# Patient Record
Sex: Male | Born: 1940 | Race: White | Hispanic: No | Marital: Married | Smoking: Never smoker
Health system: Southern US, Community
[De-identification: ages and names within clinical notes are randomized; demographics above are authoritative.]

## PROBLEM LIST (undated history)

## (undated) DIAGNOSIS — K635 Polyp of colon: Secondary | ICD-10-CM

## (undated) DIAGNOSIS — E785 Hyperlipidemia, unspecified: Secondary | ICD-10-CM

## (undated) DIAGNOSIS — C801 Malignant (primary) neoplasm, unspecified: Secondary | ICD-10-CM

## (undated) DIAGNOSIS — E781 Pure hyperglyceridemia: Secondary | ICD-10-CM

## (undated) DIAGNOSIS — F419 Anxiety disorder, unspecified: Secondary | ICD-10-CM

## (undated) DIAGNOSIS — R51 Headache: Secondary | ICD-10-CM

## (undated) DIAGNOSIS — F5104 Psychophysiologic insomnia: Secondary | ICD-10-CM

## (undated) DIAGNOSIS — L409 Psoriasis, unspecified: Secondary | ICD-10-CM

## (undated) DIAGNOSIS — Z974 Presence of external hearing-aid: Secondary | ICD-10-CM

## (undated) DIAGNOSIS — G4730 Sleep apnea, unspecified: Secondary | ICD-10-CM

## (undated) DIAGNOSIS — T7840XA Allergy, unspecified, initial encounter: Secondary | ICD-10-CM

## (undated) DIAGNOSIS — R011 Cardiac murmur, unspecified: Secondary | ICD-10-CM

## (undated) DIAGNOSIS — I10 Essential (primary) hypertension: Secondary | ICD-10-CM

## (undated) DIAGNOSIS — Z8489 Family history of other specified conditions: Secondary | ICD-10-CM

## (undated) DIAGNOSIS — K219 Gastro-esophageal reflux disease without esophagitis: Secondary | ICD-10-CM

## (undated) DIAGNOSIS — K589 Irritable bowel syndrome without diarrhea: Secondary | ICD-10-CM

## (undated) DIAGNOSIS — R519 Headache, unspecified: Secondary | ICD-10-CM

## (undated) HISTORY — PX: TONSILLECTOMY: SUR1361

## (undated) HISTORY — PX: PROSTATECTOMY: SHX69

## (undated) HISTORY — PX: CIRCUMCISION: SUR203

---

## 2017-07-05 ENCOUNTER — Encounter: Payer: Self-pay | Admitting: *Deleted

## 2017-07-06 ENCOUNTER — Encounter
Admission: RE | Disposition: A | Discharge status: Home / Self Care | Payer: Self-pay | Source: Ambulatory Visit | Attending: Gastroenterology

## 2017-07-06 ENCOUNTER — Ambulatory Visit: Payer: Medicare Other | Admitting: Anesthesiology

## 2017-07-06 ENCOUNTER — Ambulatory Visit
Admission: RE | Admit: 2017-07-06 | Discharge: 2017-07-06 | Disposition: A | Discharge status: Home / Self Care | Payer: Medicare Other | Source: Ambulatory Visit | Attending: Gastroenterology | Admitting: Gastroenterology

## 2017-07-06 DIAGNOSIS — K635 Polyp of colon: Secondary | ICD-10-CM | POA: Insufficient documentation

## 2017-07-06 DIAGNOSIS — D124 Benign neoplasm of descending colon: Secondary | ICD-10-CM | POA: Diagnosis not present

## 2017-07-06 DIAGNOSIS — Z79899 Other long term (current) drug therapy: Secondary | ICD-10-CM | POA: Insufficient documentation

## 2017-07-06 DIAGNOSIS — F5104 Psychophysiologic insomnia: Secondary | ICD-10-CM | POA: Insufficient documentation

## 2017-07-06 DIAGNOSIS — Z881 Allergy status to other antibiotic agents status: Secondary | ICD-10-CM | POA: Insufficient documentation

## 2017-07-06 DIAGNOSIS — L409 Psoriasis, unspecified: Secondary | ICD-10-CM | POA: Insufficient documentation

## 2017-07-06 DIAGNOSIS — Z885 Allergy status to narcotic agent status: Secondary | ICD-10-CM | POA: Diagnosis not present

## 2017-07-06 DIAGNOSIS — Z7982 Long term (current) use of aspirin: Secondary | ICD-10-CM | POA: Insufficient documentation

## 2017-07-06 DIAGNOSIS — Z1211 Encounter for screening for malignant neoplasm of colon: Secondary | ICD-10-CM | POA: Insufficient documentation

## 2017-07-06 DIAGNOSIS — Z880 Allergy status to penicillin: Secondary | ICD-10-CM | POA: Diagnosis not present

## 2017-07-06 DIAGNOSIS — K589 Irritable bowel syndrome without diarrhea: Secondary | ICD-10-CM | POA: Insufficient documentation

## 2017-07-06 DIAGNOSIS — D123 Benign neoplasm of transverse colon: Secondary | ICD-10-CM | POA: Insufficient documentation

## 2017-07-06 DIAGNOSIS — K5730 Diverticulosis of large intestine without perforation or abscess without bleeding: Secondary | ICD-10-CM | POA: Diagnosis not present

## 2017-07-06 DIAGNOSIS — K219 Gastro-esophageal reflux disease without esophagitis: Secondary | ICD-10-CM | POA: Diagnosis present

## 2017-07-06 DIAGNOSIS — Z86010 Personal history of colonic polyps: Secondary | ICD-10-CM | POA: Diagnosis present

## 2017-07-06 DIAGNOSIS — E781 Pure hyperglyceridemia: Secondary | ICD-10-CM | POA: Diagnosis not present

## 2017-07-06 DIAGNOSIS — E785 Hyperlipidemia, unspecified: Secondary | ICD-10-CM | POA: Insufficient documentation

## 2017-07-06 DIAGNOSIS — G4730 Sleep apnea, unspecified: Secondary | ICD-10-CM | POA: Insufficient documentation

## 2017-07-06 DIAGNOSIS — D128 Benign neoplasm of rectum: Secondary | ICD-10-CM | POA: Insufficient documentation

## 2017-07-06 DIAGNOSIS — Z888 Allergy status to other drugs, medicaments and biological substances status: Secondary | ICD-10-CM | POA: Diagnosis not present

## 2017-07-06 HISTORY — DX: Hyperlipidemia, unspecified: E78.5

## 2017-07-06 HISTORY — DX: Psychophysiologic insomnia: F51.04

## 2017-07-06 HISTORY — PX: COLONOSCOPY WITH PROPOFOL: SHX5780

## 2017-07-06 HISTORY — DX: Headache: R51

## 2017-07-06 HISTORY — DX: Anxiety disorder, unspecified: F41.9

## 2017-07-06 HISTORY — DX: Malignant (primary) neoplasm, unspecified: C80.1

## 2017-07-06 HISTORY — DX: Psoriasis, unspecified: L40.9

## 2017-07-06 HISTORY — DX: Allergy, unspecified, initial encounter: T78.40XA

## 2017-07-06 HISTORY — DX: Sleep apnea, unspecified: G47.30

## 2017-07-06 HISTORY — DX: Pure hyperglyceridemia: E78.1

## 2017-07-06 HISTORY — DX: Essential (primary) hypertension: I10

## 2017-07-06 HISTORY — DX: Headache, unspecified: R51.9

## 2017-07-06 HISTORY — DX: Irritable bowel syndrome, unspecified: K58.9

## 2017-07-06 SURGERY — COLONOSCOPY WITH PROPOFOL
Anesthesia: General

## 2017-07-06 MED ORDER — LIDOCAINE HCL (PF) 2 % IJ SOLN
INTRAMUSCULAR | Status: AC
  Filled 2017-07-06: qty 2

## 2017-07-06 MED ORDER — LIDOCAINE HCL (CARDIAC) 20 MG/ML IV SOLN
INTRAVENOUS | Status: DC | PRN
  Administered 2017-07-06: 2 mL via INTRAVENOUS

## 2017-07-06 MED ORDER — SODIUM CHLORIDE 0.9 % IV SOLN: INTRAVENOUS | Status: DC

## 2017-07-06 MED ORDER — SODIUM CHLORIDE 0.9 % IV SOLN
INTRAVENOUS | Status: DC
  Administered 2017-07-06: 08:00:00 via INTRAVENOUS

## 2017-07-06 MED ORDER — EPHEDRINE SULFATE 50 MG/ML IJ SOLN
INTRAMUSCULAR | Status: DC | PRN
  Administered 2017-07-06 (×3): 10 mg via INTRAVENOUS

## 2017-07-06 MED ORDER — EPHEDRINE SULFATE 50 MG/ML IJ SOLN
INTRAMUSCULAR | Status: AC
  Filled 2017-07-06: qty 1

## 2017-07-06 MED ORDER — PROPOFOL 10 MG/ML IV BOLUS
INTRAVENOUS | Status: AC
  Filled 2017-07-06: qty 20

## 2017-07-06 MED ORDER — PROPOFOL 500 MG/50ML IV EMUL
INTRAVENOUS | Status: DC | PRN
  Administered 2017-07-06: 120 ug/kg/min via INTRAVENOUS

## 2017-07-06 MED ORDER — PROPOFOL 10 MG/ML IV BOLUS
INTRAVENOUS | Status: DC | PRN
  Administered 2017-07-06 (×2): 30 mg via INTRAVENOUS

## 2017-07-06 NOTE — Anesthesia Postprocedure Evaluation (Signed)
Anesthesia Post Note  Patient: Anothony Bursch Hover  Procedure(s) Performed: Procedure(s) (LRB): COLONOSCOPY WITH PROPOFOL (N/A)  Patient location during evaluation: PACU Anesthesia Type: General Level of consciousness: awake Pain management: pain level controlled Vital Signs Assessment: post-procedure vital signs reviewed and stable Respiratory status: spontaneous breathing Cardiovascular status: stable Anesthetic complications: no     Last Vitals:  Vitals:   07/06/17 0950 07/06/17 1000  BP: 134/76 (!) 147/84  Pulse: 62 62  Resp: 12 18  Temp:      Last Pain:  Vitals:   07/06/17 0920  TempSrc: Tympanic                 VAN STAVEREN,Lynnette Pote

## 2017-07-06 NOTE — Anesthesia Preprocedure Evaluation (Signed)
Anesthesia Evaluation  Patient identified by MRN, date of birth, ID band Patient awake    Reviewed: Allergy & Precautions  Airway Mallampati: II       Dental  (+) Teeth Intact   Pulmonary sleep apnea ,    breath sounds clear to auscultation       Cardiovascular Exercise Tolerance: Good hypertension, Pt. on medications  Rhythm:Regular     Neuro/Psych  Headaches, Anxiety    GI/Hepatic negative GI ROS, Neg liver ROS,   Endo/Other  negative endocrine ROS  Renal/GU negative Renal ROS     Musculoskeletal   Abdominal Normal abdominal exam  (+)   Peds negative pediatric ROS (+)  Hematology   Anesthesia Other Findings   Reproductive/Obstetrics                             Anesthesia Physical Anesthesia Plan  ASA: II  Anesthesia Plan: General   Post-op Pain Management:    Induction: Intravenous  PONV Risk Score and Plan: 0  Airway Management Planned: Natural Airway and Nasal Cannula  Additional Equipment:   Intra-op Plan:   Post-operative Plan:   Informed Consent: I have reviewed the patients History and Physical, chart, labs and discussed the procedure including the risks, benefits and alternatives for the proposed anesthesia with the patient or authorized representative who has indicated his/her understanding and acceptance.     Plan Discussed with: Surgeon  Anesthesia Plan Comments:         Anesthesia Quick Evaluation

## 2017-07-06 NOTE — Op Note (Signed)
[facility] Gastroenterology Patient Name: Richard Melton Procedure Date: 07/06/2017 8:14 AM MRN: [id number] Account #: [id number] Date of Birth: 19-Jun-1941 Admit Type: Outpatient Age: 76 Room: [facility] ENDO ROOM 3 Gender: Male Note Status: Finalized Procedure:            Colonoscopy Indications:          Personal history of colonic polyps Providers:            Lollie Sails, MD Referring MD:         Irven Easterly. Kary Kos, MD (Referring MD) Medicines:            Monitored Anesthesia Care Complications:        No immediate complications. Procedure:            Pre-Anesthesia Assessment:                       - ASA Grade Assessment: III - A patient with severe                        systemic disease.                       After obtaining informed consent, the colonoscope was                        passed under direct vision. Throughout the procedure,                        the patient's blood pressure, pulse, and oxygen                        saturations were monitored continuously. The                        Colonoscope was introduced through the anus and                        advanced to the the cecum, identified by appendiceal                        orifice and ileocecal valve. The colonoscopy was                        unusually difficult due to poor bowel prep and a                        tortuous colon. Successful completion of the procedure                        was aided by changing the patient to a supine position                        and using manual pressure. The patient tolerated the                        procedure well. The quality of the bowel preparation                        was fair. Findings:      Multiple medium-mouthed diverticula were found in the sigmoid colon  and       descending colon.      A 2 mm polyp was found in the rectum. The polyp was sessile. The polyp       was removed with a cold biopsy forceps. Resection and retrieval were   complete.      A 3 mm polyp was found in the descending colon. The polyp was sessile.       The polyp was removed with a cold biopsy forceps. Resection and       retrieval were complete.      A 3 mm polyp was found in the transverse colon. The polyp was flat. The       polyp was removed with a cold biopsy forceps. Resection and retrieval       were complete.      A 2 mm polyp was found in the hepatic flexure. The polyp was sessile.       The polyp was removed with a cold biopsy forceps. Resection and       retrieval were complete.      A 2 mm polyp was found in the proximal transverse colon. The polyp was       sessile. The polyp was removed with a cold biopsy forceps. Resection and       retrieval were complete.      The retroflexed view of the distal rectum and anal verge was normal and       showed no anal or rectal abnormalities.      The digital rectal exam was normal. Impression:           - Preparation of the colon was fair.                       - Diverticulosis in the sigmoid colon and in the                        descending colon.                       - One 2 mm polyp in the rectum, removed with a cold                        biopsy forceps. Resected and retrieved.                       - One 3 mm polyp in the descending colon, removed with                        a cold biopsy forceps. Resected and retrieved.                       - One 3 mm polyp in the transverse colon, removed with                        a cold biopsy forceps. Resected and retrieved.                       - One 2 mm polyp at the hepatic flexure, removed with a                        cold biopsy forceps. Resected and retrieved.                       -  One 2 mm polyp in the proximal transverse colon,                        removed with a cold biopsy forceps. Resected and                        retrieved.                       - The distal rectum and anal verge are normal on                         retroflexion view. Recommendation:       - Discharge patient to home. Procedure Code(s):    --- Professional ---                       [phone], Colonoscopy, flexible; with biopsy, single or                        multiple Diagnosis Code(s):    --- Professional ---                       K62.1, Rectal polyp                       D12.4, Benign neoplasm of descending colon                       D12.3, Benign neoplasm of transverse colon (hepatic                        flexure or splenic flexure)                       Z86.010, Personal history of colonic polyps                       K57.30, Diverticulosis of large intestine without                        perforation or abscess without bleeding CPT copyright 2016 American Medical Association. All rights reserved. The codes documented in this report are preliminary and upon coder review may  be revised to meet current compliance requirements. Lollie Sails, MD 07/06/2017 9:21:20 AM This report has been signed electronically. Number of Addenda: 0 Note Initiated On: 07/06/2017 8:14 AM Scope Withdrawal Time: 0 hours 22 minutes 56 seconds  Total Procedure Duration: 0 hours 38 minutes 55 seconds       [facility]

## 2017-07-06 NOTE — Transfer of Care (Signed)
Immediate Anesthesia Transfer of Care Note  Patient: Richard Melton  Procedure(s) Performed: Procedure(s): COLONOSCOPY WITH PROPOFOL (N/A)  Patient Location: PACU  Anesthesia Type:General  Level of Consciousness: awake  Airway & Oxygen Therapy: Patient Spontanous Breathing and Patient connected to nasal cannula oxygen  Post-op Assessment: Report given to RN and Post -op Vital signs reviewed and stable  Post vital signs: Reviewed and stable  Last Vitals:  Vitals:   07/06/17 0740  BP: (!) 142/85  Pulse: (!) 58  Resp: 18  Temp: (!) 35.6 C    Last Pain:  Vitals:   07/06/17 0740  TempSrc: Tympanic         Complications: No apparent anesthesia complications

## 2017-07-06 NOTE — H&P (Signed)
Outpatient short stay form Pre-procedure 07/06/2017 8:19 AM Lollie Sails MD  Primary Physician: Dr. [place] Pink  Reason for visit:  Colonoscopy  History of present illness:  Patient is a 76 year old male presenting today as above. He has personal history of adenomatous colon polyps. His last colonoscopy was apparently in 2013. He did have a tubular adenoma at that time. Tolerated his prep well. He does take 81 mg aspirin daily but is felt that. He takes no other aspirin products or blood thinning agents. It is of note that patient was initially recommended to have a upper scope at the same time as his colonoscopy however patient did not remember that recommendation. I've had a prolonged discussion within this morning. He does not have any "red flags" symptoms in terms of dysphagia or heartburn that is not responsive to medications. He does have an occasional breakthrough episode for which she takes some Gaviscon. He is taking a daily proton pump inhibitor. With further discussion with him he does feel that the upper scope is necessary at this point and does not wish to have that procedure. I recommended that we set follow him up in the clinic to make sure that he develops no other symptoms and that his reflux symptoms continue be stable.    Current Facility-Administered Medications:  .  0.9 %  sodium chloride infusion, , Intravenous, Continuous, Lollie Sails, MD, Last Rate: 20 mL/hr at 07/06/17 0804 .  0.9 %  sodium chloride infusion, , Intravenous, Continuous, Lollie Sails, MD  Prescriptions Prior to Admission  Medication Sig Dispense Refill Last Dose  . amLODipine (NORVASC) 2.5 MG tablet Take 2.5 mg by mouth daily.   07/05/2017 at Unknown time  . aspirin EC 81 MG tablet Take 81 mg by mouth daily.   07/04/2017  . esomeprazole (NEXIUM) 40 MG capsule Take 40 mg by mouth daily at 12 noon.   Past Week at Unknown time  . fexofenadine (ALLEGRA) 180 MG tablet Take 180 mg by mouth  daily.   Past Week at Unknown time  . hyoscyamine (LEVSIN, ANASPAZ) 0.125 MG tablet Take 0.125 mg by mouth every 4 (four) hours as needed.   Past Month at Unknown time  . irbesartan (AVAPRO) 300 MG tablet Take 300 mg by mouth daily.   07/06/2017 at Unknown time  . Melatonin 3 MG TABS Take 1 tablet by mouth at bedtime.   Past Week at Unknown time  . mirtazapine (REMERON) 7.5 MG tablet Take 7.5 mg by mouth at bedtime.   07/05/2017 at Unknown time  . Multiple Vitamin (MULTIVITAMIN) tablet Take 1 tablet by mouth daily.   07/01/2017  . Probiotic Product (ALIGN) 4 MG CAPS Take 2 capsules by mouth daily.   07/05/2017 at Unknown time  . rosuvastatin (CRESTOR) 10 MG tablet Take 10 mg by mouth daily.   07/05/2017 at Unknown time  . Omega 3 340 MG CPDR Take 1 capsule by mouth daily.   Not Taking at Unknown time     Allergies  Allergen Reactions  . Budeprion Sr [Bupropion]   . Gemfibrozil   . Neomycin   . Penicillins   . Percocet [Oxycodone-Acetaminophen]   . Polymyxin B   . Sertraline   . Tricor [Fenofibrate]      Past Medical History:  Diagnosis Date  . Allergic   . Anxiety   . Cancer [facility])    Prostate  . Chronic insomnia   . Headache    Migraines  . Hyperlipidemia   .  Hypertension   . Hypertriglyceridemia   . IBS (irritable bowel syndrome)   . Psoriasis   . Sleep apnea     Review of systems:      Physical Exam    Heart and lungs: Regular rate and rhythm without rub or gallop, lungs are bilaterally clear.    HEENT: Normocephalic atraumatic eyes are anicteric    Other:     Pertinant exam for procedure: Soft nontender nondistended bowel sounds positive normoactive.    Planned proceedures: Colonoscopy and indicated procedures. I have discussed the risks benefits and complications of procedures to include not limited to bleeding, infection, perforation and the risk of sedation and the patient wishes to proceed.    Lollie Sails, MD Gastroenterology 07/06/2017  8:19  AM

## 2017-07-06 NOTE — Anesthesia Post-op Follow-up Note (Signed)
Anesthesia QCDR form completed.        

## 2017-07-07 ENCOUNTER — Encounter: Payer: Self-pay | Admitting: Gastroenterology

## 2017-07-07 LAB — SURGICAL PATHOLOGY

## 2018-03-07 DIAGNOSIS — I2089 Other forms of angina pectoris: Secondary | ICD-10-CM | POA: Insufficient documentation

## 2018-03-07 DIAGNOSIS — I10 Essential (primary) hypertension: Secondary | ICD-10-CM | POA: Insufficient documentation

## 2018-03-07 DIAGNOSIS — E782 Mixed hyperlipidemia: Secondary | ICD-10-CM | POA: Insufficient documentation

## 2018-03-07 DIAGNOSIS — I208 Other forms of angina pectoris: Secondary | ICD-10-CM | POA: Insufficient documentation

## 2018-03-15 ENCOUNTER — Other Ambulatory Visit: Payer: Self-pay

## 2018-03-15 ENCOUNTER — Observation Stay
Admission: RE | Admit: 2018-03-15 | Discharge: 2018-03-16 | Disposition: A | Discharge status: Home / Self Care | Payer: Medicare Other | Source: Ambulatory Visit | Attending: Cardiology | Admitting: Cardiology

## 2018-03-15 ENCOUNTER — Encounter: Payer: Self-pay | Admitting: Emergency Medicine

## 2018-03-15 ENCOUNTER — Encounter
Admission: RE | Disposition: A | Discharge status: Home / Self Care | Payer: Self-pay | Source: Ambulatory Visit | Attending: Cardiology

## 2018-03-15 DIAGNOSIS — R0602 Shortness of breath: Secondary | ICD-10-CM

## 2018-03-15 DIAGNOSIS — Z7982 Long term (current) use of aspirin: Secondary | ICD-10-CM | POA: Diagnosis not present

## 2018-03-15 DIAGNOSIS — Z885 Allergy status to narcotic agent status: Secondary | ICD-10-CM | POA: Diagnosis not present

## 2018-03-15 DIAGNOSIS — R9430 Abnormal result of cardiovascular function study, unspecified: Secondary | ICD-10-CM

## 2018-03-15 DIAGNOSIS — R6884 Jaw pain: Secondary | ICD-10-CM | POA: Diagnosis present

## 2018-03-15 DIAGNOSIS — Z881 Allergy status to other antibiotic agents status: Secondary | ICD-10-CM | POA: Diagnosis not present

## 2018-03-15 DIAGNOSIS — Z880 Allergy status to penicillin: Secondary | ICD-10-CM | POA: Insufficient documentation

## 2018-03-15 DIAGNOSIS — I2510 Atherosclerotic heart disease of native coronary artery without angina pectoris: Secondary | ICD-10-CM | POA: Diagnosis not present

## 2018-03-15 DIAGNOSIS — Z888 Allergy status to other drugs, medicaments and biological substances status: Secondary | ICD-10-CM | POA: Diagnosis not present

## 2018-03-15 DIAGNOSIS — Z8249 Family history of ischemic heart disease and other diseases of the circulatory system: Secondary | ICD-10-CM | POA: Insufficient documentation

## 2018-03-15 DIAGNOSIS — R079 Chest pain, unspecified: Principal | ICD-10-CM | POA: Insufficient documentation

## 2018-03-15 DIAGNOSIS — Z823 Family history of stroke: Secondary | ICD-10-CM | POA: Diagnosis not present

## 2018-03-15 DIAGNOSIS — Z79899 Other long term (current) drug therapy: Secondary | ICD-10-CM | POA: Diagnosis not present

## 2018-03-15 HISTORY — PX: CORONARY STENT INTERVENTION: CATH118234

## 2018-03-15 HISTORY — PX: LEFT HEART CATH AND CORONARY ANGIOGRAPHY: CATH118249

## 2018-03-15 LAB — POCT ACTIVATED CLOTTING TIME
ACTIVATED CLOTTING TIME: 268 s
Activated Clotting Time: 224 seconds
Activated Clotting Time: 357 seconds

## 2018-03-15 LAB — CBC
HCT: 47.1 % (ref 40.0–52.0)
Hemoglobin: 15.7 g/dL (ref 13.0–18.0)
MCH: 29.2 pg (ref 26.0–34.0)
MCHC: 33.5 g/dL (ref 32.0–36.0)
MCV: 87.3 fL (ref 80.0–100.0)
Platelets: 123 10*3/uL — ABNORMAL LOW (ref 150–440)
RBC: 5.39 MIL/uL (ref 4.40–5.90)
RDW: 13.6 % (ref 11.5–14.5)
WBC: 5.6 10*3/uL (ref 3.8–10.6)

## 2018-03-15 LAB — BASIC METABOLIC PANEL
ANION GAP: 9 (ref 5–15)
BUN: 16 mg/dL (ref 6–20)
CHLORIDE: 108 mmol/L (ref 101–111)
CO2: 25 mmol/L (ref 22–32)
Calcium: 9 mg/dL (ref 8.9–10.3)
Creatinine, Ser: 1.08 mg/dL (ref 0.61–1.24)
GFR calc Af Amer: >60 mL/min (ref >60)
Glucose, Bld: 92 mg/dL (ref 65–99)
POTASSIUM: 4 mmol/L (ref 3.5–5.1)
SODIUM: 142 mmol/L (ref 135–145)

## 2018-03-15 LAB — PROTIME-INR
INR: 0.99
Prothrombin Time: 13 seconds (ref 11.4–15.2)

## 2018-03-15 SURGERY — LEFT HEART CATH AND CORONARY ANGIOGRAPHY
Anesthesia: Moderate Sedation

## 2018-03-15 MED ORDER — SODIUM CHLORIDE 0.9 % WEIGHT BASED INFUSION
3.0000 mL/kg/h | INTRAVENOUS | Status: AC
  Administered 2018-03-15: 3 mL/kg/h via INTRAVENOUS

## 2018-03-15 MED ORDER — VERAPAMIL HCL 2.5 MG/ML IV SOLN
INTRAVENOUS | Status: DC | PRN
  Administered 2018-03-15: 2.5 mg via INTRA_ARTERIAL

## 2018-03-15 MED ORDER — PANTOPRAZOLE SODIUM 40 MG PO TBEC
40.0000 mg | DELAYED_RELEASE_TABLET | Freq: Every day | ORAL | Status: DC
  Administered 2018-03-15: 40 mg via ORAL
  Filled 2018-03-15: qty 1

## 2018-03-15 MED ORDER — MELATONIN 5 MG PO TABS
5.0000 mg | ORAL_TABLET | Freq: Every day | ORAL | Status: DC
  Administered 2018-03-15: 5 mg via ORAL
  Filled 2018-03-15: qty 1

## 2018-03-15 MED ORDER — IOPAMIDOL (ISOVUE-300) INJECTION 61%
INTRAVENOUS | Status: DC | PRN
  Administered 2018-03-15: 335 mL via INTRA_ARTERIAL

## 2018-03-15 MED ORDER — AMLODIPINE BESYLATE 5 MG PO TABS: 2.5000 mg | ORAL_TABLET | Freq: Every day | ORAL | Status: DC

## 2018-03-15 MED ORDER — TIROFIBAN HCL IV 12.5 MG/250 ML
INTRAVENOUS | Status: AC
  Filled 2018-03-15: qty 250

## 2018-03-15 MED ORDER — AMLODIPINE BESYLATE 5 MG PO TABS
2.5000 mg | ORAL_TABLET | Freq: Every day | ORAL | Status: DC
  Administered 2018-03-15: 2.5 mg via ORAL
  Filled 2018-03-15: qty 1

## 2018-03-15 MED ORDER — SODIUM CHLORIDE 0.9% FLUSH
3.0000 mL | Freq: Two times a day (BID) | INTRAVENOUS | Status: DC
  Administered 2018-03-15: 3 mL via INTRAVENOUS

## 2018-03-15 MED ORDER — HEPARIN SODIUM (PORCINE) 1000 UNIT/ML IJ SOLN
INTRAMUSCULAR | Status: AC
  Filled 2018-03-15: qty 1

## 2018-03-15 MED ORDER — ASPIRIN 81 MG PO CHEW
CHEWABLE_TABLET | ORAL | Status: AC
Start: 2018-03-15 — End: 2018-03-15
  Filled 2018-03-15: qty 3

## 2018-03-15 MED ORDER — MIRTAZAPINE 15 MG PO TABS
15.0000 mg | ORAL_TABLET | Freq: Every day | ORAL | Status: DC
  Administered 2018-03-15: 15 mg via ORAL
  Filled 2018-03-15: qty 1

## 2018-03-15 MED ORDER — TIROFIBAN HCL IV 12.5 MG/250 ML
INTRAVENOUS | Status: AC | PRN
  Administered 2018-03-15: 0.15 ug/kg/min via INTRAVENOUS

## 2018-03-15 MED ORDER — ASPIRIN 81 MG PO CHEW
CHEWABLE_TABLET | ORAL | Status: AC
  Filled 2018-03-15: qty 1

## 2018-03-15 MED ORDER — NITROGLYCERIN 1 MG/10 ML FOR IR/CATH LAB
INTRA_ARTERIAL | Status: DC | PRN
  Administered 2018-03-15: 200 ug via INTRACORONARY
  Administered 2018-03-15: 300 ug via INTRACORONARY

## 2018-03-15 MED ORDER — FENTANYL CITRATE (PF) 100 MCG/2ML IJ SOLN
INTRAMUSCULAR | Status: DC | PRN
  Administered 2018-03-15 (×2): 25 ug via INTRAVENOUS

## 2018-03-15 MED ORDER — CLOPIDOGREL BISULFATE 75 MG PO TABS
75.0000 mg | ORAL_TABLET | Freq: Every day | ORAL | Status: DC
  Administered 2018-03-16: 75 mg via ORAL
  Filled 2018-03-15: qty 1

## 2018-03-15 MED ORDER — FENTANYL CITRATE (PF) 100 MCG/2ML IJ SOLN
INTRAMUSCULAR | Status: AC
  Filled 2018-03-15: qty 2

## 2018-03-15 MED ORDER — ONDANSETRON HCL 4 MG/2ML IJ SOLN
4.0000 mg | Freq: Four times a day (QID) | INTRAMUSCULAR | Status: DC | PRN
Start: 2018-03-15 — End: 2018-03-16

## 2018-03-15 MED ORDER — ASPIRIN 81 MG PO CHEW
81.0000 mg | CHEWABLE_TABLET | Freq: Every day | ORAL | Status: DC
  Administered 2018-03-16: 81 mg via ORAL
  Filled 2018-03-15: qty 1

## 2018-03-15 MED ORDER — MIDAZOLAM HCL 2 MG/2ML IJ SOLN
INTRAMUSCULAR | Status: AC
  Filled 2018-03-15: qty 2

## 2018-03-15 MED ORDER — VERAPAMIL HCL 2.5 MG/ML IV SOLN
INTRAVENOUS | Status: AC
  Filled 2018-03-15: qty 2

## 2018-03-15 MED ORDER — NITROGLYCERIN 5 MG/ML IV SOLN
INTRAVENOUS | Status: AC
  Filled 2018-03-15: qty 10

## 2018-03-15 MED ORDER — ASPIRIN 81 MG PO CHEW
CHEWABLE_TABLET | ORAL | Status: DC | PRN
  Administered 2018-03-15: 243 mg via ORAL

## 2018-03-15 MED ORDER — SODIUM CHLORIDE 0.9 % IV SOLN: 250.0000 mL | INTRAVENOUS | Status: DC | PRN

## 2018-03-15 MED ORDER — SODIUM CHLORIDE 0.9% FLUSH: 3.0000 mL | Freq: Two times a day (BID) | INTRAVENOUS | Status: DC

## 2018-03-15 MED ORDER — SODIUM CHLORIDE 0.9 % WEIGHT BASED INFUSION: 1.0000 mL/kg/h | INTRAVENOUS | Status: DC

## 2018-03-15 MED ORDER — ASPIRIN 81 MG PO CHEW: 81.0000 mg | CHEWABLE_TABLET | ORAL | Status: DC

## 2018-03-15 MED ORDER — IRBESARTAN 150 MG PO TABS
300.0000 mg | ORAL_TABLET | Freq: Every day | ORAL | Status: DC
  Filled 2018-03-15: qty 1
  Filled 2018-03-15: qty 2

## 2018-03-15 MED ORDER — ACETAMINOPHEN 500 MG PO TABS
ORAL_TABLET | ORAL | Status: AC
  Administered 2018-03-15: 500 mg via ORAL
  Filled 2018-03-15: qty 1

## 2018-03-15 MED ORDER — LABETALOL HCL 5 MG/ML IV SOLN: 10.0000 mg | INTRAVENOUS | Status: AC | PRN

## 2018-03-15 MED ORDER — TIROFIBAN HCL IN NACL 5-0.9 MG/100ML-% IV SOLN
0.1500 ug/kg/min | INTRAVENOUS | Status: AC
  Administered 2018-03-15 – 2018-03-16 (×3): 0.15 ug/kg/min via INTRAVENOUS
  Filled 2018-03-15 (×3): qty 100

## 2018-03-15 MED ORDER — CLOPIDOGREL BISULFATE 75 MG PO TABS
ORAL_TABLET | ORAL | Status: DC | PRN
  Administered 2018-03-15: 600 mg via ORAL

## 2018-03-15 MED ORDER — HYDRALAZINE HCL 20 MG/ML IJ SOLN: 5.0000 mg | INTRAMUSCULAR | Status: AC | PRN

## 2018-03-15 MED ORDER — HEPARIN (PORCINE) IN NACL 2-0.9 UNIT/ML-% IJ SOLN
INTRAMUSCULAR | Status: AC
  Filled 2018-03-15: qty 1000

## 2018-03-15 MED ORDER — CLOPIDOGREL BISULFATE 75 MG PO TABS
ORAL_TABLET | ORAL | Status: AC
  Filled 2018-03-15: qty 8

## 2018-03-15 MED ORDER — MIDAZOLAM HCL 2 MG/2ML IJ SOLN
INTRAMUSCULAR | Status: DC | PRN
  Administered 2018-03-15 (×2): 1 mg via INTRAVENOUS

## 2018-03-15 MED ORDER — HEPARIN SODIUM (PORCINE) 1000 UNIT/ML IJ SOLN
INTRAMUSCULAR | Status: DC | PRN
  Administered 2018-03-15: 4000 [IU] via INTRAVENOUS
  Administered 2018-03-15 (×2): 3000 [IU] via INTRAVENOUS

## 2018-03-15 MED ORDER — SODIUM CHLORIDE 0.9% FLUSH: 3.0000 mL | INTRAVENOUS | Status: DC | PRN

## 2018-03-15 MED ORDER — ROSUVASTATIN CALCIUM 10 MG PO TABS
10.0000 mg | ORAL_TABLET | Freq: Every day | ORAL | Status: DC
  Administered 2018-03-16: 10 mg via ORAL
  Filled 2018-03-15 (×2): qty 1

## 2018-03-15 MED ORDER — ACETAMINOPHEN 500 MG PO TABS
500.0000 mg | ORAL_TABLET | Freq: Four times a day (QID) | ORAL | Status: DC | PRN
  Administered 2018-03-15: 500 mg via ORAL

## 2018-03-15 MED ORDER — TIROFIBAN (AGGRASTAT) BOLUS VIA INFUSION
INTRAVENOUS | Status: DC | PRN
  Administered 2018-03-15: 2427.5 ug via INTRAVENOUS

## 2018-03-15 MED ORDER — SODIUM CHLORIDE 0.9 % WEIGHT BASED INFUSION
1.0000 mL/kg/h | INTRAVENOUS | Status: AC
  Administered 2018-03-15 (×2): 1 mL/kg/h via INTRAVENOUS

## 2018-03-15 SURGICAL SUPPLY — 27 items
BALLN MINITREK RX 2.0X12 (BALLOONS) ×4
BALLN MINITREK RX 2.0X20 (BALLOONS) ×4
BALLN TREK RX 2.5X20 (BALLOONS) ×4
BALLN ~~LOC~~ EUPHORA RX 3.25X20 (BALLOONS) ×4
BALLOON MINITREK RX 2.0X12 (BALLOONS) ×2 IMPLANT
BALLOON MINITREK RX 2.0X20 (BALLOONS) ×2 IMPLANT
BALLOON TREK RX 2.5X20 (BALLOONS) ×2 IMPLANT
BALLOON ~~LOC~~ EUPHORA RX 3.25X20 (BALLOONS) ×2 IMPLANT
CATH 5F 110X4 TIG (CATHETERS) ×4 IMPLANT
CATH INFINITI 5FR ANG PIGTAIL (CATHETERS) ×4 IMPLANT
CATH INFINITI 5FR JL5 (CATHETERS) ×4 IMPLANT
CATH INFINITI JR4 5F (CATHETERS) ×4 IMPLANT
CATH VISTA GUIDE 6FR XB4 (CATHETERS) ×4 IMPLANT
DEVICE INFLAT 30 PLUS (MISCELLANEOUS) ×4 IMPLANT
DEVICE RAD COMP TR BAND LRG (VASCULAR PRODUCTS) ×4 IMPLANT
GLIDESHEATH SLEND A-KIT 6F 22G (SHEATH) IMPLANT
GLIDESHEATH SLEND SS 6F .021 (SHEATH) ×4 IMPLANT
GUIDEWIRE INQWIRE 1.5J.035X260 (WIRE) ×2 IMPLANT
INQWIRE 1.5J .035X260CM (WIRE) ×4
KIT MANI 3VAL PERCEP (MISCELLANEOUS) ×4 IMPLANT
NEEDLE PERC 18GX7CM (NEEDLE) IMPLANT
PACK CARDIAC CATH (CUSTOM PROCEDURE TRAY) ×4 IMPLANT
SHEATH AVANTI 5FR X 11CM (SHEATH) IMPLANT
STENT RESOLUTE ONYX 2.75X26 (Permanent Stent) ×4 IMPLANT
STENT RESOLUTE ONYX 3.0X26 (Permanent Stent) ×4 IMPLANT
WIRE ASAHI PROWATER 180CM (WIRE) ×4 IMPLANT
WIRE GUIDERIGHT .035X150 (WIRE) IMPLANT

## 2018-03-15 NOTE — Progress Notes (Signed)
Pt states that he use CPAP at home every night at home doctor [place] states that they can bring their won CPAP to use in the hospital. Also ordered to start pt on Melatonin 3 mg (1 tablet) daily at bedtime and esomeprazole 40 mg tablet daily. Will continue to monitor.

## 2018-03-15 NOTE — Progress Notes (Signed)
Pt has no sign of dry mouth and refused the oral care order set. Will continue to monitor.

## 2018-03-15 NOTE — Plan of Care (Signed)
  Problem: Clinical Measurements: Goal: Cardiovascular complication will be avoided Outcome: Progressing   Problem: Pain Managment: Goal: General experience of comfort will improve Outcome: Progressing   Problem: Safety: Goal: Ability to remain free from injury will improve Outcome: Progressing   Problem: Skin Integrity: Goal: Risk for impaired skin integrity will decrease Outcome: Progressing   

## 2018-03-16 DIAGNOSIS — R079 Chest pain, unspecified: Secondary | ICD-10-CM | POA: Diagnosis not present

## 2018-03-16 LAB — CBC
HCT: 41.5 % (ref 40.0–52.0)
HEMOGLOBIN: 14.2 g/dL (ref 13.0–18.0)
MCH: 29.3 pg (ref 26.0–34.0)
MCHC: 34.2 g/dL (ref 32.0–36.0)
MCV: 85.6 fL (ref 80.0–100.0)
PLATELETS: 118 10*3/uL — AB (ref 150–440)
RBC: 4.85 MIL/uL (ref 4.40–5.90)
RDW: 13.5 % (ref 11.5–14.5)
WBC: 6.7 10*3/uL (ref 3.8–10.6)

## 2018-03-16 LAB — BASIC METABOLIC PANEL
Anion gap: 9 (ref 5–15)
BUN: 20 mg/dL (ref 6–20)
CALCIUM: 8.4 mg/dL — AB (ref 8.9–10.3)
CO2: 22 mmol/L (ref 22–32)
CREATININE: 0.94 mg/dL (ref 0.61–1.24)
Chloride: 107 mmol/L (ref 101–111)
GFR calc Af Amer: >60 mL/min (ref >60)
GFR calc non Af Amer: >60 mL/min (ref >60)
GLUCOSE: 96 mg/dL (ref 65–99)
Potassium: 3.7 mmol/L (ref 3.5–5.1)
Sodium: 138 mmol/L (ref 135–145)

## 2018-03-16 MED ORDER — CLOPIDOGREL BISULFATE 75 MG PO TABS: 75.0000 mg | ORAL_TABLET | Freq: Every day | ORAL | 11 refills | Status: AC

## 2018-03-16 NOTE — Care Management Obs Status (Signed)
[place] NOTIFICATION   Patient Details  Name: OTNIEL HOE MRN: [id number] Date of Birth: [birth year]   Medicare Observation Status Notification Given:  No  Discharge order placed in < 24hr of being placed on observation   Katrina Stack, RN 03/16/2018, 8:34 AM

## 2018-03-16 NOTE — Progress Notes (Signed)
Rounded on patient.  Patient presented to [facility] for Outpatient Cardiac Cath on 03/15/2018.  Patient required PCI with two coronary stents placed to distal left circumflex.    Patient for discharge today.  Discussed CAD with patient.  Reviewed the location of stent placement.  "Angioplasty and Stents" booklet provided and reviewed with patient.   Patient requesting a picture of the location of stents.  Printed patient's cath report with diagram of coronary tree.    Discussed modifiable risk factors including controlling blood pressure, cholesterol, and blood sugar; following heart healthy diet; maintaining healthy weight; exercise; and smoking cessation if applicable. ? Risk factors for patient include:   HTN, HLD.  Patient is a NEVER smoker.    Discussed cardiac medications including rationale for taking, mechanisms of action, and side effects. ? Discussed emergency plan for heart attack symptoms. Patient verbalized understanding of need to call 911 and not to drive himself to ER if having cardiac symptoms / chest pain. ?  Heart healthy diet of low sodium, low fat, low cholesterol heart healthy diet discussed.   Exercise - Patient reports walking 30 minutes per day and that is how he knew he had a problem.  Note:  Twenty minutes into his walk he started developing jaw pain.  He was worked up as an outpatient.   Benefits of exercise discussed. Explained to patient that he has been referred to Cardiac Rehab. ?An overview of the program was provided. ?Benefits of participating in the program were discussed. ?Patient declined to participate in Cardiac Rehab program, as he lives at [facility], and there are many exercise programs there.  In addition, patient plans to continue walking.    Patient asked pertinent questions and verbalized appreciation for the information provided. ?  Roanna Epley, RN, BSN, [facility] Cardiovascular and Pulmonary Nurse Navigator

## 2018-03-16 NOTE — Progress Notes (Signed)
IVs and tele removed from patient. Discharge instructions given to patient along with hard copy prescription. No acute distress at this time. Wife is at bedside and will transport patient home.

## 2018-03-16 NOTE — Discharge Summary (Signed)
Physician Discharge Summary  Patient ID: Richard Melton MRN: [id number] DOB/AGE: 23-Jun-1941 77 y.o.  Admit date: 03/15/2018 Discharge date: 03/16/2018  Primary Discharge Diagnosis Chest pain Secondary Discharge Diagnosis Coronary artery disease  Significant Diagnostic Studies: yes  Consults: None  Hospital Course: The patient underwent cardiac catheterization on 03/15/2018, which revealed chronically occluded ostial LAD, and 90% stenosis distal dominant left circumflex.  The patient underwent PCI, receiving overlapping Resolute Onyx drug-eluting stents in the distal left circumflex with an excellent angiographic result.  The patient had an uncomplicated hospital stay without recurrent chest pain.  On the morning of 03/16/2018 the patient was ambulating without difficulty and was discharged home.   Discharge Exam: Blood pressure (!) 158/86, pulse (!) 57, temperature 97.6 F (36.4 C), temperature source Oral, resp. rate 16, height 6' (1.829 m), weight 93.8 kg (206 lb 12.8 oz), SpO2 100 %.   General appearance: alert Head: Normocephalic, without obvious abnormality, atraumatic Eyes: conjunctivae/corneas clear. PERRL, EOM's intact. Fundi benign. Ears: normal TM's and external ear canals both ears Nose: Nares normal. Septum midline. Mucosa normal. No drainage or sinus tenderness. Throat: lips, mucosa, and tongue normal; teeth and gums normal Neck: no adenopathy, no carotid bruit, no JVD, supple, symmetrical, trachea midline and thyroid not enlarged, symmetric, no tenderness/mass/nodules Back: symmetric, no curvature. ROM normal. No CVA tenderness. Resp: clear to auscultation bilaterally Chest wall: no tenderness Cardio: regular rate and rhythm, S1, S2 normal, no murmur, click, rub or gallop GI: soft, non-tender; bowel sounds normal; no masses,  no organomegaly Extremities: extremities normal, atraumatic, no cyanosis or edema Pulses: 2+ and symmetric Skin: Skin color, texture, turgor  normal. No rashes or lesions Lymph nodes: Cervical, supraclavicular, and axillary nodes normal. Neurologic: Grossly normal Labs:   Lab Results  Component Value Date   WBC 6.7 03/16/2018   HGB 14.2 03/16/2018   HCT 41.5 03/16/2018   MCV 85.6 03/16/2018   PLT 118 (L) 03/16/2018    Recent Labs  Lab 03/15/18 0705  NA 142  K 4.0  CL 108  CO2 25  BUN 16  CREATININE 1.08  CALCIUM 9.0  GLUCOSE 92      Radiology:  EKG: NSR  FOLLOW UP PLANS AND APPOINTMENTS Discharge Instructions    AMB Referral to Cardiac Rehabilitation - Phase II   Complete by:  As directed    Diagnosis:  Coronary Stents     Allergies as of 03/16/2018      Reactions   Bupropion Nausea Only   Codeine Nausea Only   Gemfibrozil Nausea Only   Oxycodone-acetaminophen Nausea Only   Polymyxin B Other (See Comments)   Unknown   Sertraline Nausea Only   Fenofibrate Micronized Anxiety   Neomycin Rash   Penicillin G Rash, Other (See Comments)   Has patient had a PCN reaction causing immediate rash, facial/tongue/throat swelling, SOB or lightheadedness with hypotension: No Has patient had a PCN reaction causing severe rash involving mucus membranes or skin necrosis: No Has patient had a PCN reaction that required hospitalization: No - MD office Has patient had a PCN reaction occurring within the last 10 years: No If all of the above answers are "NO", then may proceed with Cephalosporin use.   Tricor [fenofibrate] Anxiety      Medication List    TAKE these medications   acetaminophen 500 MG tablet Commonly known as:  TYLENOL Take 500 mg by mouth every 6 (six) hours as needed for moderate pain or headache.   ALIGN 4 MG Caps Take  8 mg by mouth daily.   amLODipine 2.5 MG tablet Commonly known as:  NORVASC Take 2.5 mg by mouth every evening.   aspirin EC 81 MG tablet Take 81 mg by mouth daily.   CENTRUM SILVER PO Take 1 tablet by mouth daily.   clopidogrel 75 MG tablet Commonly known as:   PLAVIX Take 1 tablet (75 mg total) by mouth daily.   esomeprazole 40 MG capsule Commonly known as:  NEXIUM Take 40 mg by mouth daily.   fexofenadine 180 MG tablet Commonly known as:  ALLEGRA Take 180 mg by mouth every evening.   hyoscyamine 0.125 MG tablet Commonly known as:  LEVSIN, ANASPAZ Take 0.125 mg by mouth every 4 (four) hours as needed for cramping.   Melatonin 3 MG Tabs Take 3 mg by mouth at bedtime.   mirtazapine 15 MG tablet Commonly known as:  REMERON Take 7.5 mg by mouth at bedtime.   mometasone 0.1 % lotion Commonly known as:  ELOCON Apply 4 drops topically daily as needed (for ear itch).   olmesartan 40 MG tablet Commonly known as:  BENICAR Take 40 mg by mouth daily.   OMEGA 3 PO Take 1 capsule by mouth 2 (two) times daily.   rosuvastatin 10 MG tablet Commonly known as:  CRESTOR Take 10 mg by mouth daily.   sodium chloride 0.65 % Soln nasal spray Commonly known as:  OCEAN Place 1-2 sprays into both nostrils as needed for congestion.      Follow-up Information    Humaira Sculley, MD Follow up in 1 week(s).   Specialty:  Cardiology Contact information: [facility] [place] [address] [phone]           BRING ALL MEDICATIONS WITH YOU TO FOLLOW UP APPOINTMENTS  Time spent with patient to include physician time: 25 min Signed:  Isaias Cowman MD, PhD, [facility] 03/16/2018, 7:53 AM

## 2018-03-16 NOTE — Plan of Care (Signed)
  Problem: Pain Managment: ?Goal: General experience of comfort will improve ?Outcome: Progressing ?  ?Problem: Cardiovascular: ?Goal: Ability to achieve and maintain adequate cardiovascular perfusion will improve ?Outcome: Progressing ?Goal: Vascular access site(s) Level 0-1 will be maintained ?Outcome: Progressing ?  ?

## 2018-03-23 DIAGNOSIS — Z955 Presence of coronary angioplasty implant and graft: Secondary | ICD-10-CM | POA: Insufficient documentation

## 2018-03-31 ENCOUNTER — Encounter: Payer: Medicare Other | Attending: Cardiology | Admitting: *Deleted

## 2018-03-31 ENCOUNTER — Encounter: Payer: Self-pay | Admitting: *Deleted

## 2018-03-31 VITALS — Ht 73.4 in | Wt 211.1 lb

## 2018-03-31 DIAGNOSIS — E785 Hyperlipidemia, unspecified: Secondary | ICD-10-CM | POA: Insufficient documentation

## 2018-03-31 DIAGNOSIS — Z7982 Long term (current) use of aspirin: Secondary | ICD-10-CM | POA: Diagnosis not present

## 2018-03-31 DIAGNOSIS — Z955 Presence of coronary angioplasty implant and graft: Secondary | ICD-10-CM | POA: Diagnosis present

## 2018-03-31 DIAGNOSIS — I10 Essential (primary) hypertension: Secondary | ICD-10-CM | POA: Insufficient documentation

## 2018-03-31 DIAGNOSIS — Z79899 Other long term (current) drug therapy: Secondary | ICD-10-CM | POA: Insufficient documentation

## 2018-03-31 NOTE — Progress Notes (Signed)
Daily Session Note  Patient Details  Name: Richard Melton MRN: [id number] Date of Birth: 1941/09/04 Referring Provider:     Cardiac Rehab from 03/31/2018 in [facility] Cardiac and Pulmonary Rehab  Referring Provider  [name]      Encounter Date: 03/31/2018  Check In: Session Check In - 03/31/18 1422      Check-In   Location  [facility]-Cardiac & Pulmonary Rehab    Staff Present  Nada Maclachlan, BA, ACSM CEP, Exercise Physiologist;Yash Cacciola Frederico Hamman, RN BSN;Meredith Sherryll Burger, RN BSN    Supervising physician immediately available to respond to emergencies  See telemetry face sheet for immediately available ER MD    Medication changes reported      No    Fall or balance concerns reported     No    Tobacco Cessation  No Change    Warm-up and Cool-down  Performed as group-led instruction    Resistance Training Performed  Yes    VAD Patient?  No      Pain Assessment   Currently in Pain?  No/denies    Pain Score  0-No pain    Multiple Pain Sites  No        Exercise Prescription Changes - 03/31/18 1400      Response to Exercise   Blood Pressure (Admit)  138/60    Blood Pressure (Exercise)  142/66    Blood Pressure (Exit)  118/56    Heart Rate (Admit)  69 bpm    Heart Rate (Exercise)  96 bpm    Heart Rate (Exit)  64 bpm    Oxygen Saturation (Admit)  99 %    Oxygen Saturation (Exercise)  98 %    Rating of Perceived Exertion (Exercise)  11       Social History   Tobacco Use  Smoking Status Never Smoker  Smokeless Tobacco Never Used    Goals Met:  Exercise tolerated well Personal goals reviewed No report of cardiac concerns or symptoms  Goals Unmet:  Not Applicable  Comments: Med review and 14m   Dr. MEmily Filbertis Medical Director for [facility].

## 2018-03-31 NOTE — Patient Instructions (Signed)
Patient Instructions  Patient Details  Name: Richard Melton MRN: [id number] Date of Birth: [birth year] Referring Provider:  Isaias Cowman, MD  Below are your personal goals for exercise, nutrition, and risk factors. Our goal is to help you stay on track towards obtaining and maintaining these goals. We will be discussing your progress on these goals with you throughout the program.  Initial Exercise Prescription: Initial Exercise Prescription - 03/31/18 1400      Date of Initial Exercise RX and Referring Provider   Date  03/31/18    Referring Provider  [name]      Treadmill   MPH  2.8    Grade  0    Minutes  15    METs  3.14      Arm Ergometer   Level  2    Watts  50    RPM  40    Minutes  15    METs  3      Recumbant Elliptical   Level  3    RPM  50    Minutes  15    METs  3      Prescription Details   Frequency (times per week)  3    Duration  Progress to 45 minutes of aerobic exercise without signs/symptoms of physical distress      Intensity   THRR 40-80% of Max Heartrate  94-128    Ratings of Perceived Exertion  11-15    Perceived Dyspnea  0-4      Resistance Training   Training Prescription  Yes    Weight  4 lb    Reps  10-15       Exercise Goals: Frequency: Be able to perform aerobic exercise two to three times per week in program working toward 2-5 days per week of home exercise.  Intensity: Work with a perceived exertion of 11 (fairly light) - 15 (hard) while following your exercise prescription.  We will make changes to your prescription with you as you progress through the program.   Duration: Be able to do 30 to 45 minutes of continuous aerobic exercise in addition to a 5 minute warm-up and a 5 minute cool-down routine.   Nutrition Goals: Your personal nutrition goals will be established when you do your nutrition analysis with the dietician.  The following are general nutrition guidelines to follow: Cholesterol < 200mg /day Sodium <  1500mg /day Fiber: Men over 50 yrs - 30 grams per day  Personal Goals: Personal Goals and Risk Factors at Admission - 03/31/18 1428      Core Components/Risk Factors/Patient Goals on Admission    Weight Management  Yes;Weight Loss    Intervention  Weight Management: Develop a combined nutrition and exercise program designed to reach desired caloric intake, while maintaining appropriate intake of nutrient and fiber, sodium and fats, and appropriate energy expenditure required for the weight goal.;Weight Management: Provide education and appropriate resources to help participant work on and attain dietary goals.;Weight Management/Obesity: Establish reasonable short term and long term weight goals.    Admit Weight  211 lb (95.7 kg)    Goal Weight: Short Term  205 lb (93 kg)    Goal Weight: Long Term  199 lb (90.3 kg)    Expected Outcomes  Short Term: Continue to assess and modify interventions until short term weight is achieved;Long Term: Adherence to nutrition and physical activity/exercise program aimed toward attainment of established weight goal;Weight Maintenance: Understanding of the daily nutrition guidelines, which includes 25-35% calories  from fat, 7% or less cal from saturated fats, less than 200mg  cholesterol, less than 1.5gm of sodium, & 5 or more servings of fruits and vegetables daily;Weight Loss: Understanding of general recommendations for a balanced deficit meal plan, which promotes 1-2 lb weight loss per week and includes a negative energy balance of [phone] kcal/d;Understanding of distribution of calorie intake throughout the day with the consumption of 4-5 meals/snacks;Understanding recommendations for meals to include 15-35% energy as protein, 25-35% energy from fat, 35-60% energy from carbohydrates, less than 200mg  of dietary cholesterol, 20-35 gm of total fiber daily    Hypertension  Yes    Intervention  Provide education on lifestyle modifcations including regular physical  activity/exercise, weight management, moderate sodium restriction and increased consumption of fresh fruit, vegetables, and low fat dairy, alcohol moderation, and smoking cessation.;Monitor prescription use compliance.    Expected Outcomes  Short Term: Continued assessment and intervention until BP is < 140/79mm HG in hypertensive participants. < 130/40mm HG in hypertensive participants with diabetes, heart failure or chronic kidney disease.;Long Term: Maintenance of blood pressure at goal levels.    Lipids  Yes elevated triglycerides    Intervention  Provide education and support for participant on nutrition & aerobic/resistive exercise along with prescribed medications to achieve LDL 70mg , HDL >40mg .    Expected Outcomes  Short Term: Participant states understanding of desired cholesterol values and is compliant with medications prescribed. Participant is following exercise prescription and nutrition guidelines.;Long Term: Cholesterol controlled with medications as prescribed, with individualized exercise RX and with personalized nutrition plan. Value goals: LDL < 70mg , HDL > 40 mg.    Stress  Yes    Intervention  Offer individual and/or small group education and counseling on adjustment to heart disease, stress management and health-related lifestyle change. Teach and support self-help strategies.;Refer participants experiencing significant psychosocial distress to appropriate mental health specialists for further evaluation and treatment. When possible, include family members and significant others in education/counseling sessions.    Expected Outcomes  Short Term: Participant demonstrates changes in health-related behavior, relaxation and other stress management skills, ability to obtain effective social support, and compliance with psychotropic medications if prescribed.;Long Term: Emotional wellbeing is indicated by absence of clinically significant psychosocial distress or social isolation.     Personal Goal Other  Yes    Personal Goal  Return to playing pickle ball and bicycling.    Intervention  Will attent class work to improve stamina and endurance.     Expected Outcomes  He will be able to have enough stamina and endurance to return to playing pickle ball and go longer distances on his bike.        Tobacco Use Initial Evaluation: Social History   Tobacco Use  Smoking Status Never Smoker  Smokeless Tobacco Never Used    Exercise Goals and Review: Exercise Goals    Row Name 03/31/18 1445             Exercise Goals   Increase Physical Activity  Yes       Intervention  Provide advice, education, support and counseling about physical activity/exercise needs.;Develop an individualized exercise prescription for aerobic and resistive training based on initial evaluation findings, risk stratification, comorbidities and participant's personal goals.       Expected Outcomes  Short Term: Attend rehab on a regular basis to increase amount of physical activity.;Long Term: Add in home exercise to make exercise part of routine and to increase amount of physical activity.;Long Term: Exercising regularly at least 3-5  days a week.       Increase Strength and Stamina  Yes       Intervention  Provide advice, education, support and counseling about physical activity/exercise needs.;Develop an individualized exercise prescription for aerobic and resistive training based on initial evaluation findings, risk stratification, comorbidities and participant's personal goals.       Expected Outcomes  Short Term: Increase workloads from initial exercise prescription for resistance, speed, and METs.;Short Term: Perform resistance training exercises routinely during rehab and add in resistance training at home;Long Term: Improve cardiorespiratory fitness, muscular endurance and strength as measured by increased METs and functional capacity (6MWT)       Able to understand and use rate of perceived  exertion (RPE) scale  Yes       Intervention  Provide education and explanation on how to use RPE scale       Expected Outcomes  Short Term: Able to use RPE daily in rehab to express subjective intensity level;Long Term:  Able to use RPE to guide intensity level when exercising independently       Able to understand and use Dyspnea scale  Yes       Intervention  Provide education and explanation on how to use Dyspnea scale       Expected Outcomes  Short Term: Able to use Dyspnea scale daily in rehab to express subjective sense of shortness of breath during exertion;Long Term: Able to use Dyspnea scale to guide intensity level when exercising independently       Knowledge and understanding of Target Heart Rate Range (THRR)  Yes       Intervention  Provide education and explanation of THRR including how the numbers were predicted and where they are located for reference       Expected Outcomes  Short Term: Able to state/look up THRR;Short Term: Able to use daily as guideline for intensity in rehab;Long Term: Able to use THRR to govern intensity when exercising independently       Able to check pulse independently  Yes       Intervention  Provide education and demonstration on how to check pulse in carotid and radial arteries.;Review the importance of being able to check your own pulse for safety during independent exercise       Expected Outcomes  Short Term: Able to explain why pulse checking is important during independent exercise;Long Term: Able to check pulse independently and accurately       Understanding of Exercise Prescription  Yes       Intervention  Provide education, explanation, and written materials on patient's individual exercise prescription       Expected Outcomes  Short Term: Able to explain program exercise prescription;Long Term: Able to explain home exercise prescription to exercise independently          Copy of goals given to participant.

## 2018-03-31 NOTE — Progress Notes (Signed)
Cardiac Individual Treatment Plan  Patient Details  Name: Richard Melton MRN: [id number] Date of Birth: 1941/09/18 Referring Provider:     Cardiac Rehab from 03/31/2018 in [facility] Cardiac and Pulmonary Rehab  Referring Provider  [name]      Initial Encounter Date:    Cardiac Rehab from 03/31/2018 in [facility] Cardiac and Pulmonary Rehab  Date  03/31/18  Referring Provider  [name]      Visit Diagnosis: Status post coronary artery stent placement  Patient's Home Medications on Admission:  Current Outpatient Medications:  .  acetaminophen (TYLENOL) 500 MG tablet, Take 500 mg by mouth every 6 (six) hours as needed for moderate pain or headache., Disp: , Rfl:  .  amLODipine (NORVASC) 2.5 MG tablet, Take 2.5 mg by mouth every evening. , Disp: , Rfl:  .  aspirin EC 81 MG tablet, Take 81 mg by mouth daily., Disp: , Rfl:  .  clopidogrel (PLAVIX) 75 MG tablet, Take 1 tablet (75 mg total) by mouth daily., Disp: 30 tablet, Rfl: 11 .  esomeprazole (NEXIUM) 40 MG capsule, Take 40 mg by mouth daily. , Disp: , Rfl:  .  fexofenadine (ALLEGRA) 180 MG tablet, Take 180 mg by mouth every evening. , Disp: , Rfl:  .  hyoscyamine (LEVSIN, ANASPAZ) 0.125 MG tablet, Take 0.125 mg by mouth every 4 (four) hours as needed for cramping. , Disp: , Rfl:  .  isosorbide mononitrate (IMDUR) 30 MG 24 hr tablet, , Disp: , Rfl: 11 .  Melatonin 3 MG TABS, Take 3 mg by mouth at bedtime. , Disp: , Rfl:  .  mirtazapine (REMERON) 15 MG tablet, Take 7.5 mg by mouth at bedtime., Disp: , Rfl:  .  mometasone (ELOCON) 0.1 % lotion, Apply 4 drops topically daily as needed (for ear itch)., Disp: , Rfl:  .  Multiple Vitamins-Minerals (CENTRUM SILVER PO), Take 1 tablet by mouth daily., Disp: , Rfl:  .  olmesartan (BENICAR) 40 MG tablet, Take 40 mg by mouth daily., Disp: , Rfl:  .  Omega-3 Fatty Acids (OMEGA 3 PO), Take 1 capsule by mouth 2 (two) times daily. , Disp: , Rfl:  .  Probiotic Product (ALIGN) 4 MG CAPS, Take 8 mg by mouth  daily. , Disp: , Rfl:  .  rosuvastatin (CRESTOR) 10 MG tablet, Take 10 mg by mouth daily., Disp: , Rfl:  .  sodium chloride (OCEAN) 0.65 % SOLN nasal spray, Place 1-2 sprays into both nostrils as needed for congestion., Disp: , Rfl:   Past Medical History: Past Medical History:  Diagnosis Date  . Allergic   . Anxiety   . Cancer [facility])    Prostate  . Chronic insomnia   . Headache    Migraines  . Hyperlipidemia   . Hypertension   . Hypertriglyceridemia   . IBS (irritable bowel syndrome)   . Psoriasis   . Sleep apnea     Tobacco Use: Social History   Tobacco Use  Smoking Status Never Smoker  Smokeless Tobacco Never Used    Labs: Recent Review Flowsheet Data    There is no flowsheet data to display.       Exercise Target Goals: Date: 03/31/18  Exercise Program Goal: Individual exercise prescription set using results from initial 6 min walk test and THRR while considering  patient's activity barriers and safety.   Exercise Prescription Goal: Initial exercise prescription builds to 30-45 minutes a day of aerobic activity, 2-3 days per week.  Home exercise guidelines will be given to  patient during program as part of exercise prescription that the participant will acknowledge.  Activity Barriers & Risk Stratification: Activity Barriers & Cardiac Risk Stratification - 03/31/18 1435      Activity Barriers & Cardiac Risk Stratification   Activity Barriers  Back Problems;Joint Problems    Cardiac Risk Stratification  High       6 Minute Walk: 6 Minute Walk    Row Name 03/31/18 1446         6 Minute Walk   Distance  1542 feet     Walk Time  6 minutes     # of Rest Breaks  0     MPH  2.92     METS  3.18     RPE  11     Perceived Dyspnea   0     VO2 Peak  11.12     Symptoms  Yes (comment)     Comments  jaw pain2/10 that resolved with rest - has spoken with Dr - not a new s/s     Resting HR  60 bpm     Resting BP  138/60     Resting Oxygen Saturation   98 %      Exercise Oxygen Saturation  during 6 min walk  99 %     Max Ex. HR  96 bpm     Max Ex. BP  142/66     2 Minute Post BP  118/56        Oxygen Initial Assessment: Oxygen Initial Assessment - 03/31/18 1440      Home Oxygen   Sleep Oxygen Prescription  CPAP    Liters per minute  2       Oxygen Re-Evaluation:   Oxygen Discharge (Final Oxygen Re-Evaluation):   Initial Exercise Prescription: Initial Exercise Prescription - 03/31/18 1400      Date of Initial Exercise RX and Referring Provider   Date  03/31/18    Referring Provider  [name]      Treadmill   MPH  2.8    Grade  0    Minutes  15    METs  3.14      Arm Ergometer   Level  2    Watts  50    RPM  40    Minutes  15    METs  3      Recumbant Elliptical   Level  3    RPM  50    Minutes  15    METs  3      Prescription Details   Frequency (times per week)  3    Duration  Progress to 45 minutes of aerobic exercise without signs/symptoms of physical distress      Intensity   THRR 40-80% of Max Heartrate  94-128    Ratings of Perceived Exertion  11-15    Perceived Dyspnea  0-4      Resistance Training   Training Prescription  Yes    Weight  4 lb    Reps  10-15       Perform Capillary Blood Glucose checks as needed.  Exercise Prescription Changes: Exercise Prescription Changes    Row Name 03/31/18 1400             Response to Exercise   Blood Pressure (Admit)  138/60       Blood Pressure (Exercise)  142/66       Blood Pressure (Exit)  118/56  Heart Rate (Admit)  69 bpm       Heart Rate (Exercise)  96 bpm       Heart Rate (Exit)  64 bpm       Oxygen Saturation (Admit)  99 %       Oxygen Saturation (Exercise)  98 %       Rating of Perceived Exertion (Exercise)  11          Exercise Comments:   Exercise Goals and Review: Exercise Goals    Row Name 03/31/18 1445             Exercise Goals   Increase Physical Activity  Yes       Intervention  Provide advice, education,  support and counseling about physical activity/exercise needs.;Develop an individualized exercise prescription for aerobic and resistive training based on initial evaluation findings, risk stratification, comorbidities and participant's personal goals.       Expected Outcomes  Short Term: Attend rehab on a regular basis to increase amount of physical activity.;Long Term: Add in home exercise to make exercise part of routine and to increase amount of physical activity.;Long Term: Exercising regularly at least 3-5 days a week.       Increase Strength and Stamina  Yes       Intervention  Provide advice, education, support and counseling about physical activity/exercise needs.;Develop an individualized exercise prescription for aerobic and resistive training based on initial evaluation findings, risk stratification, comorbidities and participant's personal goals.       Expected Outcomes  Short Term: Increase workloads from initial exercise prescription for resistance, speed, and METs.;Short Term: Perform resistance training exercises routinely during rehab and add in resistance training at home;Long Term: Improve cardiorespiratory fitness, muscular endurance and strength as measured by increased METs and functional capacity (6MWT)       Able to understand and use rate of perceived exertion (RPE) scale  Yes       Intervention  Provide education and explanation on how to use RPE scale       Expected Outcomes  Short Term: Able to use RPE daily in rehab to express subjective intensity level;Long Term:  Able to use RPE to guide intensity level when exercising independently       Able to understand and use Dyspnea scale  Yes       Intervention  Provide education and explanation on how to use Dyspnea scale       Expected Outcomes  Short Term: Able to use Dyspnea scale daily in rehab to express subjective sense of shortness of breath during exertion;Long Term: Able to use Dyspnea scale to guide intensity level when  exercising independently       Knowledge and understanding of Target Heart Rate Range (THRR)  Yes       Intervention  Provide education and explanation of THRR including how the numbers were predicted and where they are located for reference       Expected Outcomes  Short Term: Able to state/look up THRR;Short Term: Able to use daily as guideline for intensity in rehab;Long Term: Able to use THRR to govern intensity when exercising independently       Able to check pulse independently  Yes       Intervention  Provide education and demonstration on how to check pulse in carotid and radial arteries.;Review the importance of being able to check your own pulse for safety during independent exercise       Expected Outcomes  Short  Term: Able to explain why pulse checking is important during independent exercise;Long Term: Able to check pulse independently and accurately       Understanding of Exercise Prescription  Yes       Intervention  Provide education, explanation, and written materials on patient's individual exercise prescription       Expected Outcomes  Short Term: Able to explain program exercise prescription;Long Term: Able to explain home exercise prescription to exercise independently          Exercise Goals Re-Evaluation :   Discharge Exercise Prescription (Final Exercise Prescription Changes): Exercise Prescription Changes - 03/31/18 1400      Response to Exercise   Blood Pressure (Admit)  138/60    Blood Pressure (Exercise)  142/66    Blood Pressure (Exit)  118/56    Heart Rate (Admit)  69 bpm    Heart Rate (Exercise)  96 bpm    Heart Rate (Exit)  64 bpm    Oxygen Saturation (Admit)  99 %    Oxygen Saturation (Exercise)  98 %    Rating of Perceived Exertion (Exercise)  11       Nutrition:  Target Goals: Understanding of nutrition guidelines, daily intake of sodium <1554m, cholesterol <2034m calories 30% from fat and 7% or less from saturated fats, daily to have 5 or more  servings of fruits and vegetables.  Biometrics: Pre Biometrics - 03/31/18 1444      Pre Biometrics   Height  6' 1.4" (1.864 m)    Weight  211 lb 1.6 oz (95.8 kg)    Waist Circumference  41.5 inches    Hip Circumference  40 inches    Waist to Hip Ratio  1.04 %    BMI (Calculated)  27.56        Nutrition Therapy Plan and Nutrition Goals:   Nutrition Assessments: Nutrition Assessments - 03/31/18 1430      MEDFICTS Scores   Pre Score  30       Nutrition Goals Re-Evaluation:   Nutrition Goals Discharge (Final Nutrition Goals Re-Evaluation):   Psychosocial: Target Goals: Acknowledge presence or absence of significant depression and/or stress, maximize coping skills, provide positive support system. Participant is able to verbalize types and ability to use techniques and skills needed for reducing stress and depression.   Initial Review & Psychosocial Screening: Initial Psych Review & Screening - 03/31/18 1431      Initial Review   Current issues with  Current Anxiety/Panic;Current Sleep Concerns;Current Stress Concerns    Source of Stress Concerns  Chronic Illness;Unable to participate in former interests or hobbies    Comments  He had prostate CA in 2008 and still stresses about a relapse of cancer of some type. States they have undergone some [place]. He also struggles some socially and has stress since his event being able to be as active as he once was.       Family Dynamics   Good Support System?  Yes    Comments  wife and [place] community      Barriers   Psychosocial barriers to participate in program  The patient should benefit from training in stress management and relaxation.      Screening Interventions   Interventions  Encouraged to exercise;Program counselor consult    Expected Outcomes  Short Term goal: Utilizing psychosocial counselor, staff and physician to  assist  with identification of specific Stressors or current issues interfering with healing process. Setting desired goal for each stressor or current issue identified.;Long Term Goal: Stressors or current issues are controlled or eliminated.;Short Term goal: Identification and review with participant of any Quality of Life or Depression concerns found by scoring the questionnaire.;Long Term goal: The participant improves quality of Life and PHQ9 Scores as seen by post scores and/or verbalization of changes       Quality of Life Scores:  Quality of Life - 03/31/18 1434      Quality of Life Scores   Health/Function Pre  19.92 %    Socioeconomic Pre  25 %    Psych/Spiritual Pre  20.43 %    Family Pre  24 %    GLOBAL Pre  21.71 %      Scores of 19 and below usually indicate a poorer quality of life in these areas.  A difference of  2-3 points is a clinically meaningful difference.  A difference of 2-3 points in the total score of the Quality of Life Index has been associated with significant improvement in overall quality of life, self-image, physical symptoms, and general health in studies assessing change in quality of life.  PHQ-9: Recent Review Flowsheet Data    Depression screen [facility] 2/9 03/31/2018   Decreased Interest 0   Down, Depressed, Hopeless 0   PHQ - 2 Score 0   Altered sleeping 0   Tired, decreased energy 1   Change in appetite 2   Feeling bad or failure about yourself  1   Trouble concentrating 0   Moving slowly or fidgety/restless 0   Suicidal thoughts 0   PHQ-9 Score 4   Difficult doing work/chores Not difficult at all     Interpretation of Total Score  Total Score Depression Severity:  1-4 = Minimal depression, 5-9 = Mild depression, 10-14 = Moderate depression, 15-19 = Moderately severe depression, 20-27 = Severe depression   Psychosocial Evaluation and Intervention:   Psychosocial Re-Evaluation:   Psychosocial Discharge (Final Psychosocial  Re-Evaluation):   Vocational Rehabilitation: Provide vocational rehab assistance to qualifying candidates.   Vocational Rehab Evaluation & Intervention: Vocational Rehab - 03/31/18 1439      Initial Vocational Rehab Evaluation & Intervention   Assessment shows need for Vocational Rehabilitation  No       Education: Education Goals: Education classes will be provided on a variety of topics geared toward better understanding of heart health and risk factor modification. Participant will state understanding/return demonstration of topics presented as noted by education test scores.  Learning Barriers/Preferences: Learning Barriers/Preferences - 03/31/18 1437      Learning Barriers/Preferences   Learning Barriers  Sight;Hearing wears glasses and a hearing aid    Learning Preferences  Individual Instruction;Verbal Instruction;Skilled Demonstration       Education Topics:  AED/CPR: - Group verbal and written instruction with the use of models to demonstrate the basic use of the AED with the basic ABC's of resuscitation.   General Nutrition Guidelines/Fats and Fiber: -Group instruction provided by verbal, written material, models and posters to present the general guidelines for heart healthy nutrition. Gives an explanation and review of dietary fats and fiber.   Controlling Sodium/Reading Food Labels: -Group verbal and written material supporting the discussion of sodium use in heart healthy nutrition. Review and explanation with models, verbal and written materials for utilization of the food label.   Exercise Physiology & General Exercise Guidelines: - Group verbal and written instruction with  models to review the exercise physiology of the cardiovascular system and associated critical values. Provides general exercise guidelines with specific guidelines to those with heart or lung disease.    Aerobic Exercise & Resistance Training: - Gives group verbal and written  instruction on the various components of exercise. Focuses on aerobic and resistive training programs and the benefits of this training and how to safely progress through these programs..   Flexibility, Balance, Mind/Body Relaxation: Provides group verbal/written instruction on the benefits of flexibility and balance training, including mind/body exercise modes such as yoga, pilates and tai chi.  Demonstration and skill practice provided.   Stress and Anxiety: - Provides group verbal and written instruction about the health risks of elevated stress and causes of high stress.  Discuss the correlation between heart/lung disease and anxiety and treatment options. Review healthy ways to manage with stress and anxiety.   Depression: - Provides group verbal and written instruction on the correlation between heart/lung disease and depressed mood, treatment options, and the stigmas associated with seeking treatment.   Anatomy & Physiology of the Heart: - Group verbal and written instruction and models provide basic cardiac anatomy and physiology, with the coronary electrical and arterial systems. Review of Valvular disease and Heart Failure   Cardiac Procedures: - Group verbal and written instruction to review commonly prescribed medications for heart disease. Reviews the medication, class of the drug, and side effects. Includes the steps to properly store meds and maintain the prescription regimen. (beta blockers and nitrates)   Cardiac Medications I: - Group verbal and written instruction to review commonly prescribed medications for heart disease. Reviews the medication, class of the drug, and side effects. Includes the steps to properly store meds and maintain the prescription regimen.   Cardiac Medications II: -Group verbal and written instruction to review commonly prescribed medications for heart disease. Reviews the medication, class of the drug, and side effects. (all other drug  classes)    Go Sex-Intimacy & Heart Disease, Get SMART - Goal Setting: - Group verbal and written instruction through game format to discuss heart disease and the return to sexual intimacy. Provides group verbal and written material to discuss and apply goal setting through the application of the S.M.A.R.T. Method.   Other Matters of the Heart: - Provides group verbal, written materials and models to describe Stable Angina and Peripheral Artery. Includes description of the disease process and treatment options available to the cardiac patient.   Exercise & Equipment Safety: - Individual verbal instruction and demonstration of equipment use and safety with use of the equipment.   Cardiac Rehab from 03/31/2018 in [facility] Cardiac and Pulmonary Rehab  Date  03/31/18  Educator  [facility]  Instruction Review Code  1- Verbalizes Understanding      Infection Prevention: - Provides verbal and written material to individual with discussion of infection control including proper hand washing and proper equipment cleaning during exercise session.   Cardiac Rehab from 03/31/2018 in [facility] Cardiac and Pulmonary Rehab  Date  03/31/18  Educator  [facility]  Instruction Review Code  1- Verbalizes Understanding      Falls Prevention: - Provides verbal and written material to individual with discussion of falls prevention and safety.   Cardiac Rehab from 03/31/2018 in [facility] Cardiac and Pulmonary Rehab  Date  03/31/18  Educator  [facility]  Instruction Review Code  1- Verbalizes Understanding      Diabetes: - Individual verbal and written instruction to review signs/symptoms of diabetes, desired ranges of glucose  level fasting, after meals and with exercise. Acknowledge that pre and post exercise glucose checks will be done for 3 sessions at entry of program.   Know Your Numbers and Risk Factors: -Group verbal and written instruction about important numbers in your health.  Discussion of what are risk factors and how they  play a role in the disease process.  Review of Cholesterol, Blood Pressure, Diabetes, and BMI and the role they play in your overall health.   Sleep Hygiene: -Provides group verbal and written instruction about how sleep can affect your health.  Define sleep hygiene, discuss sleep cycles and impact of sleep habits. Review good sleep hygiene tips.    Other: -Provides group and verbal instruction on various topics (see comments)   Knowledge Questionnaire Score: Knowledge Questionnaire Score - 03/31/18 1438      Knowledge Questionnaire Score   Pre Score  23/28 correct answers reviewed with patient who verbalized understanding       Core Components/Risk Factors/Patient Goals at Admission: Personal Goals and Risk Factors at Admission - 03/31/18 1428      Core Components/Risk Factors/Patient Goals on Admission    Weight Management  Yes;Weight Loss    Intervention  Weight Management: Develop a combined nutrition and exercise program designed to reach desired caloric intake, while maintaining appropriate intake of nutrient and fiber, sodium and fats, and appropriate energy expenditure required for the weight goal.;Weight Management: Provide education and appropriate resources to help participant work on and attain dietary goals.;Weight Management/Obesity: Establish reasonable short term and long term weight goals.    Admit Weight  211 lb (95.7 kg)    Goal Weight: Short Term  205 lb (93 kg)    Goal Weight: Long Term  199 lb (90.3 kg)    Expected Outcomes  Short Term: Continue to assess and modify interventions until short term weight is achieved;Long Term: Adherence to nutrition and physical activity/exercise program aimed toward attainment of established weight goal;Weight Maintenance: Understanding of the daily nutrition guidelines, which includes 25-35% calories from fat, 7% or less cal from saturated fats, less than 261m cholesterol, less than 1.5gm of sodium, & 5 or more servings of fruits  and vegetables daily;Weight Loss: Understanding of general recommendations for a balanced deficit meal plan, which promotes 1-2 lb weight loss per week and includes a negative energy balance of [phone] kcal/d;Understanding of distribution of calorie intake throughout the day with the consumption of 4-5 meals/snacks;Understanding recommendations for meals to include 15-35% energy as protein, 25-35% energy from fat, 35-60% energy from carbohydrates, less than 2051mof dietary cholesterol, 20-35 gm of total fiber daily    Hypertension  Yes    Intervention  Provide education on lifestyle modifcations including regular physical activity/exercise, weight management, moderate sodium restriction and increased consumption of fresh fruit, vegetables, and low fat dairy, alcohol moderation, and smoking cessation.;Monitor prescription use compliance.    Expected Outcomes  Short Term: Continued assessment and intervention until BP is < 140/9089mG in hypertensive participants. < 130/74m57m in hypertensive participants with diabetes, heart failure or chronic kidney disease.;Long Term: Maintenance of blood pressure at goal levels.    Lipids  Yes elevated triglycerides    Intervention  Provide education and support for participant on nutrition & aerobic/resistive exercise along with prescribed medications to achieve LDL <70mg35mL >40mg.30mExpected Outcomes  Short Term: Participant states understanding of desired cholesterol values and is compliant with medications prescribed. Participant is following exercise prescription and nutrition guidelines.;Long Term: Cholesterol controlled  with medications as prescribed, with individualized exercise RX and with personalized nutrition plan. Value goals: LDL < 48m, HDL > 40 mg.    Stress  Yes    Intervention  Offer individual and/or small group education and counseling on adjustment to heart disease, stress management and health-related lifestyle change. Teach and support  self-help strategies.;Refer participants experiencing significant psychosocial distress to appropriate mental health specialists for further evaluation and treatment. When possible, include family members and significant others in education/counseling sessions.    Expected Outcomes  Short Term: Participant demonstrates changes in health-related behavior, relaxation and other stress management skills, ability to obtain effective social support, and compliance with psychotropic medications if prescribed.;Long Term: Emotional wellbeing is indicated by absence of clinically significant psychosocial distress or social isolation.    Personal Goal Other  Yes    Personal Goal  Return to playing pickle ball and bicycling.    Intervention  Will attent class work to improve stamina and endurance.     Expected Outcomes  He will be able to have enough stamina and endurance to return to playing pickle ball and go longer distances on his bike.        Core Components/Risk Factors/Patient Goals Review:    Core Components/Risk Factors/Patient Goals at Discharge (Final Review):    ITP Comments: ITP Comments    Row Name 03/31/18 1424           ITP Comments  Medical Review Completed; initial ITP created. [place]          Comments: Initial ITP

## 2018-04-04 ENCOUNTER — Encounter: Payer: Medicare Other | Admitting: *Deleted

## 2018-04-04 DIAGNOSIS — Z955 Presence of coronary angioplasty implant and graft: Secondary | ICD-10-CM

## 2018-04-04 DIAGNOSIS — Z7982 Long term (current) use of aspirin: Secondary | ICD-10-CM | POA: Diagnosis not present

## 2018-04-04 NOTE — Progress Notes (Signed)
Daily Session Note  Patient Details  Name: DELVECCHIO MADOLE MRN: [id number] Date of Birth: 01/22/1941 Referring Provider:     Cardiac Rehab from 03/31/2018 in [facility] Cardiac and Pulmonary Rehab  Referring Provider  [name]      Encounter Date: 04/04/2018  Check In: Session Check In - 04/04/18 0758      Check-In   Location  [facility]-Cardiac & Pulmonary Rehab    Staff Present  Alberteen Sam, MA, RCEP, CCRP, Exercise Physiologist;Abigial [place] Amedeo Plenty, BS, ACSM CEP, Exercise Physiologist;[name], RN, BSN, CCRP    Supervising physician immediately available to respond to emergencies  See telemetry face sheet for immediately available ER MD    Medication changes reported      No    Fall or balance concerns reported     No    Warm-up and Cool-down  Performed on first and last piece of equipment    Resistance Training Performed  Yes    VAD Patient?  No      Pain Assessment   Currently in Pain?  No/denies          Social History   Tobacco Use  Smoking Status Never Smoker  Smokeless Tobacco Never Used    Goals Met:  Independence with exercise equipment Exercise tolerated well No report of cardiac concerns or symptoms Strength training completed today  Personal goals reviewed  Goals Unmet:  Not Applicable  Comments:First full day of exercise!  Patient was oriented to gym and equipment including functions, settings, policies, and procedures.  Patient's individual exercise prescription and treatment plan were reviewed.  All starting workloads were established based on the results of the 6 minute walk test done at initial orientation visit.  The plan for exercise progression was also introduced and progression will be customized based on patient's performance and goals.  Ples reported jaw pain on the elliptical today.  It is the same jaw pain that he was having prior to his stents. He has continued to have jaw pain since his stents, thus is on Imdur.  Today it got to a 3/10.  We talked  about making sure he lets the staff know about it and not to exceed a 6/10 before taking a rest break.    Dr. Emily Filbert is Medical Director for [place] and [facility].

## 2018-04-06 DIAGNOSIS — Z955 Presence of coronary angioplasty implant and graft: Secondary | ICD-10-CM

## 2018-04-06 DIAGNOSIS — Z7982 Long term (current) use of aspirin: Secondary | ICD-10-CM | POA: Diagnosis not present

## 2018-04-06 NOTE — Progress Notes (Signed)
Daily Session Note  Patient Details  Name: Richard Melton MRN: [id number] Date of Birth: 1941-11-04 Referring Provider:     Cardiac Rehab from 03/31/2018 in [facility] Cardiac and Pulmonary Rehab  Referring Provider  [name]      Encounter Date: 04/06/2018  Check In: Session Check In - 04/06/18 0837      Check-In   Location  [facility]-Cardiac & Pulmonary Rehab    Staff Present  Justin Mend RCP,RRT,BSRT;Heath Lark, RN, BSN, CCRP;Jessica Luan Pulling, MA, RCEP, CCRP, Exercise Physiologist    Supervising physician immediately available to respond to emergencies  See telemetry face sheet for immediately available ER MD    Medication changes reported      No    Fall or balance concerns reported     No    Tobacco Cessation  No Change    Warm-up and Cool-down  Performed on first and last piece of equipment    Resistance Training Performed  Yes    VAD Patient?  No      Pain Assessment   Currently in Pain?  No/denies          Social History   Tobacco Use  Smoking Status Never Smoker  Smokeless Tobacco Never Used    Goals Met:  Independence with exercise equipment Exercise tolerated well No report of cardiac concerns or symptoms Strength training completed today  Goals Unmet:  Not Applicable  Comments: Pt able to follow exercise prescription today without complaint.  Will continue to monitor for progression.   Dr. Emily Filbert is Medical Director for [place] and [facility].

## 2018-04-08 ENCOUNTER — Encounter: Payer: Medicare Other | Admitting: *Deleted

## 2018-04-08 DIAGNOSIS — Z7982 Long term (current) use of aspirin: Secondary | ICD-10-CM | POA: Diagnosis not present

## 2018-04-08 DIAGNOSIS — Z955 Presence of coronary angioplasty implant and graft: Secondary | ICD-10-CM

## 2018-04-08 NOTE — Progress Notes (Signed)
Daily Session Note  Patient Details  Name: Richard Melton MRN: [id number] Date of Birth: 1941/04/23 Referring Provider:     Cardiac Rehab from 03/31/2018 in [facility] Cardiac and Pulmonary Rehab  Referring Provider  [name]      Encounter Date: 04/08/2018  Check In: Session Check In - 04/08/18 0856      Check-In   Location  [facility]-Cardiac & Pulmonary Rehab    Staff Present  Renita Papa, RN BSN;Renee Beale Luan Pulling, MA, RCEP, CCRP, Exercise Physiologist;Mandi Zachery Conch, BS, [facility]    Supervising physician immediately available to respond to emergencies  See telemetry face sheet for immediately available ER MD    Medication changes reported      No    Fall or balance concerns reported     No    Warm-up and Cool-down  Performed on first and last piece of equipment    Resistance Training Performed  Yes    VAD Patient?  No      Pain Assessment   Currently in Pain?  No/denies          Social History   Tobacco Use  Smoking Status Never Smoker  Smokeless Tobacco Never Used    Goals Met:  Independence with exercise equipment Exercise tolerated well No report of cardiac concerns or symptoms Strength training completed today  Goals Unmet:  Not Applicable  Comments: Pt able to follow exercise prescription today without complaint.  Will continue to monitor for progression. Reviewed home exercise with pt today.  Pt plans to walk and go to gym at [facility] for exercise.  Reviewed THR, pulse, RPE, sign and symptoms, and when to call 911 or MD.  Also discussed weather considerations and indoor options.  Pt voiced understanding.    Dr. Emily Filbert is Medical Director for [place] and [facility].

## 2018-04-11 ENCOUNTER — Encounter: Payer: Medicare Other | Admitting: *Deleted

## 2018-04-11 DIAGNOSIS — Z955 Presence of coronary angioplasty implant and graft: Secondary | ICD-10-CM

## 2018-04-11 DIAGNOSIS — Z7982 Long term (current) use of aspirin: Secondary | ICD-10-CM | POA: Diagnosis not present

## 2018-04-11 NOTE — Progress Notes (Signed)
Daily Session Note  Patient Details  Name: Richard Melton MRN: [id number] Date of Birth: 10/26/1941 Referring Provider:     Cardiac Rehab from 03/31/2018 in [facility] Cardiac and Pulmonary Rehab  Referring Provider  [name]      Encounter Date: 04/11/2018  Check In: Session Check In - 04/11/18 0806      Check-In   Location  [facility]-Cardiac & Pulmonary Rehab    Staff Present  [name], RN, BSN, CCRP;Jessica Hawkins, MA, RCEP, CCRP, Exercise Physiologist;Kelly Hayes, BS, ACSM CEP, Exercise Physiologist    Supervising physician immediately available to respond to emergencies  See telemetry face sheet for immediately available ER MD    Medication changes reported      No    Fall or balance concerns reported     No    Warm-up and Cool-down  Performed on first and last piece of equipment    Resistance Training Performed  Yes    VAD Patient?  No      Pain Assessment   Currently in Pain?  No/denies          Social History   Tobacco Use  Smoking Status Never Smoker  Smokeless Tobacco Never Used    Goals Met:  Independence with exercise equipment Exercise tolerated well No report of cardiac concerns or symptoms Strength training completed today  Goals Unmet:  Not Applicable  Comments: Pt able to follow exercise prescription today without complaint.  Will continue to monitor for progression.    Dr. Mark Miller is Medical Director for [facility] and [facility]. 

## 2018-04-13 ENCOUNTER — Encounter: Payer: Self-pay | Admitting: *Deleted

## 2018-04-13 DIAGNOSIS — Z955 Presence of coronary angioplasty implant and graft: Secondary | ICD-10-CM

## 2018-04-13 DIAGNOSIS — Z7982 Long term (current) use of aspirin: Secondary | ICD-10-CM | POA: Diagnosis not present

## 2018-04-13 NOTE — Progress Notes (Signed)
Daily Session Note  Patient Details  Name: Richard Melton MRN: [id number] Date of Birth: 09/04/1941 Referring Provider:     Cardiac Rehab from 03/31/2018 in [facility] Cardiac and Pulmonary Rehab  Referring Provider  [name]      Encounter Date: 04/13/2018  Check In: Session Check In - 04/13/18 0805      Check-In   Location  [facility]-Cardiac & Pulmonary Rehab    Staff Present  Justin Mend RCP,RRT,BSRT;Heath Lark, RN, BSN, CCRP;Jessica Luan Pulling, MA, RCEP, CCRP, Exercise Physiologist    Supervising physician immediately available to respond to emergencies  See telemetry face sheet for immediately available ER MD    Medication changes reported      No    Fall or balance concerns reported     No    Tobacco Cessation  No Change    Warm-up and Cool-down  Performed on first and last piece of equipment    Resistance Training Performed  Yes    VAD Patient?  No      Pain Assessment   Currently in Pain?  No/denies          Social History   Tobacco Use  Smoking Status Never Smoker  Smokeless Tobacco Never Used    Goals Met:  Independence with exercise equipment Exercise tolerated well No report of cardiac concerns or symptoms Strength training completed today  Goals Unmet:  Not Applicable  Comments: Pt able to follow exercise prescription today without complaint.  Will continue to monitor for progression.   Dr. Emily Filbert is Medical Director for [place] and [facility].

## 2018-04-13 NOTE — Progress Notes (Signed)
Cardiac Individual Treatment Plan  Patient Details  Name: Richard Melton MRN: [id number] Date of Birth: 01-Oct-1941 Referring Provider:     Cardiac Rehab from 03/31/2018 in [facility] Cardiac and Pulmonary Rehab  Referring Provider  [name]      Initial Encounter Date:    Cardiac Rehab from 03/31/2018 in [facility] Cardiac and Pulmonary Rehab  Date  03/31/18  Referring Provider  [name]      Visit Diagnosis: Status post coronary artery stent placement  Patient's Home Medications on Admission:  Current Outpatient Medications:  .  acetaminophen (TYLENOL) 500 MG tablet, Take 500 mg by mouth every 6 (six) hours as needed for moderate pain or headache., Disp: , Rfl:  .  amLODipine (NORVASC) 2.5 MG tablet, Take 2.5 mg by mouth every evening. , Disp: , Rfl:  .  aspirin EC 81 MG tablet, Take 81 mg by mouth daily., Disp: , Rfl:  .  clopidogrel (PLAVIX) 75 MG tablet, Take 1 tablet (75 mg total) by mouth daily., Disp: 30 tablet, Rfl: 11 .  esomeprazole (NEXIUM) 40 MG capsule, Take 40 mg by mouth daily. , Disp: , Rfl:  .  fexofenadine (ALLEGRA) 180 MG tablet, Take 180 mg by mouth every evening. , Disp: , Rfl:  .  hyoscyamine (LEVSIN, ANASPAZ) 0.125 MG tablet, Take 0.125 mg by mouth every 4 (four) hours as needed for cramping. , Disp: , Rfl:  .  isosorbide mononitrate (IMDUR) 30 MG 24 hr tablet, , Disp: , Rfl: 11 .  Melatonin 3 MG TABS, Take 3 mg by mouth at bedtime. , Disp: , Rfl:  .  mirtazapine (REMERON) 15 MG tablet, Take 7.5 mg by mouth at bedtime., Disp: , Rfl:  .  mometasone (ELOCON) 0.1 % lotion, Apply 4 drops topically daily as needed (for ear itch)., Disp: , Rfl:  .  Multiple Vitamins-Minerals (CENTRUM SILVER PO), Take 1 tablet by mouth daily., Disp: , Rfl:  .  olmesartan (BENICAR) 40 MG tablet, Take 40 mg by mouth daily., Disp: , Rfl:  .  Omega-3 Fatty Acids (OMEGA 3 PO), Take 1 capsule by mouth 2 (two) times daily. , Disp: , Rfl:  .  Probiotic Product (ALIGN) 4 MG CAPS, Take 8 mg by mouth  daily. , Disp: , Rfl:  .  rosuvastatin (CRESTOR) 10 MG tablet, Take 10 mg by mouth daily., Disp: , Rfl:  .  sodium chloride (OCEAN) 0.65 % SOLN nasal spray, Place 1-2 sprays into both nostrils as needed for congestion., Disp: , Rfl:   Past Medical History: Past Medical History:  Diagnosis Date  . Allergic   . Anxiety   . Cancer [facility])    Prostate  . Chronic insomnia   . Headache    Migraines  . Hyperlipidemia   . Hypertension   . Hypertriglyceridemia   . IBS (irritable bowel syndrome)   . Psoriasis   . Sleep apnea     Tobacco Use: Social History   Tobacco Use  Smoking Status Never Smoker  Smokeless Tobacco Never Used    Labs: Recent Review Flowsheet Data    There is no flowsheet data to display.       Exercise Target Goals:    Exercise Program Goal: Individual exercise prescription set using results from initial 6 min walk test and THRR while considering  patient's activity barriers and safety.   Exercise Prescription Goal: Initial exercise prescription builds to 30-45 minutes a day of aerobic activity, 2-3 days per week.  Home exercise guidelines will be given to  patient during program as part of exercise prescription that the participant will acknowledge.  Activity Barriers & Risk Stratification: Activity Barriers & Cardiac Risk Stratification - 03/31/18 1435      Activity Barriers & Cardiac Risk Stratification   Activity Barriers  Back Problems;Joint Problems    Cardiac Risk Stratification  High       6 Minute Walk: 6 Minute Walk    Row Name 03/31/18 1446         6 Minute Walk   Distance  1542 feet     Walk Time  6 minutes     # of Rest Breaks  0     MPH  2.92     METS  3.18     RPE  11     Perceived Dyspnea   0     VO2 Peak  11.12     Symptoms  Yes (comment)     Comments  jaw pain2/10 that resolved with rest - has spoken with Dr - not a new s/s     Resting HR  60 bpm     Resting BP  138/60     Resting Oxygen Saturation   98 %     Exercise  Oxygen Saturation  during 6 min walk  99 %     Max Ex. HR  96 bpm     Max Ex. BP  142/66     2 Minute Post BP  118/56        Oxygen Initial Assessment: Oxygen Initial Assessment - 03/31/18 1440      Home Oxygen   Sleep Oxygen Prescription  CPAP    Liters per minute  2       Oxygen Re-Evaluation:   Oxygen Discharge (Final Oxygen Re-Evaluation):   Initial Exercise Prescription: Initial Exercise Prescription - 03/31/18 1400      Date of Initial Exercise RX and Referring Provider   Date  03/31/18    Referring Provider  [name]      Treadmill   MPH  2.8    Grade  0    Minutes  15    METs  3.14      Arm Ergometer   Level  2    Watts  50    RPM  40    Minutes  15    METs  3      Recumbant Elliptical   Level  3    RPM  50    Minutes  15    METs  3      Prescription Details   Frequency (times per week)  3    Duration  Progress to 45 minutes of aerobic exercise without signs/symptoms of physical distress      Intensity   THRR 40-80% of Max Heartrate  94-128    Ratings of Perceived Exertion  11-15    Perceived Dyspnea  0-4      Resistance Training   Training Prescription  Yes    Weight  4 lb    Reps  10-15       Perform Capillary Blood Glucose checks as needed.  Exercise Prescription Changes: Exercise Prescription Changes    Row Name 03/31/18 1400 04/06/18 1500 04/08/18 0800         Response to Exercise   Blood Pressure (Admit)  138/60  142/78  -     Blood Pressure (Exercise)  142/66  142/68  -     Blood Pressure (Exit)  118/56  124/74  -     Heart Rate (Admit)  69 bpm  59 bpm  -     Heart Rate (Exercise)  96 bpm  112 bpm  -     Heart Rate (Exit)  64 bpm  66 bpm  -     Oxygen Saturation (Admit)  99 %  -  -     Oxygen Saturation (Exercise)  98 %  -  -     Rating of Perceived Exertion (Exercise)  11  13  -     Symptoms  -  none  -     Comments  -  first full day of exercise  -     Duration  -  Progress to 45 minutes of aerobic exercise without  signs/symptoms of physical distress  -     Intensity  -  THRR unchanged  -       Progression   Progression  -  Continue to progress workloads to maintain intensity without signs/symptoms of physical distress.  -     Average METs  -  2.77  -       Resistance Training   Training Prescription  -  Yes  -     Weight  -  4 lbs  -     Reps  -  10-15  -       Interval Training   Interval Training  -  No  -       Treadmill   MPH  -  2.8  -     Grade  -  0  -     Minutes  -  15  -     METs  -  3.14  -       Arm Ergometer   Level  -  1  -     Minutes  -  15  -     METs  -  2.4  -       Home Exercise Plan   Plans to continue exercise at  -  -  [employer] (comment) [employer] and walking     Frequency  -  -  Add 2 additional days to program exercise sessions.     Initial Home Exercises Provided  -  -  04/08/18        Exercise Comments: Exercise Comments    Row Name 04/04/18 0800 04/04/18 0945         Exercise Comments  :First full day of exercise!  Patient was oriented to gym and equipment including functions, settings, policies, and procedures.  Patient's individual exercise prescription and treatment plan were reviewed.  All starting workloads were established based on the results of the 6 minute walk test done at initial orientation visit.  The plan for exercise progression was also introduced and progression will be customized based on patient's performance and goals.  Jarmaine reported jaw pain on the elliptical today.  It is the same jaw pain that he was having prior to his stents. He has continued to have jaw pain since his stents, thus is on Imdur.  Today it got to a 3/10.  We talked about making sure he lets the staff know about it and not to exceed a 6/10 before taking a rest break.          Exercise Goals and Review: Exercise Goals    Row Name 03/31/18 1445  Exercise Goals   Increase Physical Activity  Yes       Intervention  Provide advice,  education, support and counseling about physical activity/exercise needs.;Develop an individualized exercise prescription for aerobic and resistive training based on initial evaluation findings, risk stratification, comorbidities and participant's personal goals.       Expected Outcomes  Short Term: Attend rehab on a regular basis to increase amount of physical activity.;Long Term: Add in home exercise to make exercise part of routine and to increase amount of physical activity.;Long Term: Exercising regularly at least 3-5 days a week.       Increase Strength and Stamina  Yes       Intervention  Provide advice, education, support and counseling about physical activity/exercise needs.;Develop an individualized exercise prescription for aerobic and resistive training based on initial evaluation findings, risk stratification, comorbidities and participant's personal goals.       Expected Outcomes  Short Term: Increase workloads from initial exercise prescription for resistance, speed, and METs.;Short Term: Perform resistance training exercises routinely during rehab and add in resistance training at home;Long Term: Improve cardiorespiratory fitness, muscular endurance and strength as measured by increased METs and functional capacity (6MWT)       Able to understand and use rate of perceived exertion (RPE) scale  Yes       Intervention  Provide education and explanation on how to use RPE scale       Expected Outcomes  Short Term: Able to use RPE daily in rehab to express subjective intensity level;Long Term:  Able to use RPE to guide intensity level when exercising independently       Able to understand and use Dyspnea scale  Yes       Intervention  Provide education and explanation on how to use Dyspnea scale       Expected Outcomes  Short Term: Able to use Dyspnea scale daily in rehab to express subjective sense of shortness of breath during exertion;Long Term: Able to use Dyspnea scale to guide intensity  level when exercising independently       Knowledge and understanding of Target Heart Rate Range (THRR)  Yes       Intervention  Provide education and explanation of THRR including how the numbers were predicted and where they are located for reference       Expected Outcomes  Short Term: Able to state/look up THRR;Short Term: Able to use daily as guideline for intensity in rehab;Long Term: Able to use THRR to govern intensity when exercising independently       Able to check pulse independently  Yes       Intervention  Provide education and demonstration on how to check pulse in carotid and radial arteries.;Review the importance of being able to check your own pulse for safety during independent exercise       Expected Outcomes  Short Term: Able to explain why pulse checking is important during independent exercise;Long Term: Able to check pulse independently and accurately       Understanding of Exercise Prescription  Yes       Intervention  Provide education, explanation, and written materials on patient's individual exercise prescription       Expected Outcomes  Short Term: Able to explain program exercise prescription;Long Term: Able to explain home exercise prescription to exercise independently          Exercise Goals Re-Evaluation : Exercise Goals Re-Evaluation    Row Name 04/04/18 [phone] 04/08/18 7564  Exercise Goal Re-Evaluation   Exercise Goals Review  Understanding of Exercise Prescription;Knowledge and understanding of Target Heart Rate Range (THRR);Able to understand and use rate of perceived exertion (RPE) scale;Increase Strength and Stamina;Increase Physical Activity  Increase Physical Activity;Able to understand and use rate of perceived exertion (RPE) scale;Knowledge and understanding of Target Heart Rate Range (THRR);Understanding of Exercise Prescription;Able to check pulse independently;Increase Strength and Stamina      Comments  Reviewed RPE scale, THR and program  prescription with pt today.  Pt voiced understanding and was given a copy of goals to take home.   Reviewed home exercise with pt today.  Pt plans to walk and go to gym at [facility] for exercise.  Reviewed THR, pulse, RPE, sign and symptoms, and when to call 911 or MD.  Also discussed weather considerations and indoor options.  Pt voiced understanding.      Expected Outcomes  Short: Use RPE daily to regulate intensity.  Long: Follow program prescription in THR.  Short: Start to add in at least two extra days a week at the gym starting in two weeks.  Long: Continue to exercise indep         Discharge Exercise Prescription (Final Exercise Prescription Changes): Exercise Prescription Changes - 04/08/18 0800      Home Exercise Plan   Plans to continue exercise at  [facility] (comment) [employer] and walking    Frequency  Add 2 additional days to program exercise sessions.    Initial Home Exercises Provided  04/08/18       Nutrition:  Target Goals: Understanding of nutrition guidelines, daily intake of sodium <1593m, cholesterol <2026m calories 30% from fat and 7% or less from saturated fats, daily to have 5 or more servings of fruits and vegetables.  Biometrics: Pre Biometrics - 03/31/18 1444      Pre Biometrics   Height  6' 1.4" (1.864 m)    Weight  211 lb 1.6 oz (95.8 kg)    Waist Circumference  41.5 inches    Hip Circumference  40 inches    Waist to Hip Ratio  1.04 %    BMI (Calculated)  27.56        Nutrition Therapy Plan and Nutrition Goals:   Nutrition Assessments: Nutrition Assessments - 03/31/18 1430      MEDFICTS Scores   Pre Score  30       Nutrition Goals Re-Evaluation:   Nutrition Goals Discharge (Final Nutrition Goals Re-Evaluation):   Psychosocial: Target Goals: Acknowledge presence or absence of significant depression and/or stress, maximize coping skills, provide positive support system. Participant is able to verbalize types and ability to  use techniques and skills needed for reducing stress and depression.   Initial Review & Psychosocial Screening: Initial Psych Review & Screening - 03/31/18 1431      Initial Review   Current issues with  Current Anxiety/Panic;Current Sleep Concerns;Current Stress Concerns    Source of Stress Concerns  Chronic Illness;Unable to participate in former interests or hobbies    Comments  He had prostate CA in 2008 and still stresses about a relapse of cancer of some type. States they have undergone some stress [place] place [place]. He also struggles some socially and has stress since his event being able to be as active as he once was.       Family Dynamics   Good Support System?  Yes    Comments  wife and [employer] [facility]      Barriers   Psychosocial barriers to participate in program  The patient should benefit from training in stress management and relaxation.      Screening Interventions   Interventions  Encouraged to exercise;Program counselor consult    Expected Outcomes  Short Term goal: Utilizing psychosocial counselor, staff and physician to assist with identification of specific Stressors or current issues interfering with healing process. Setting desired goal for each stressor or current issue identified.;Long Term Goal: Stressors or current issues are controlled or eliminated.;Short Term goal: Identification and review with participant of any Quality of Life or Depression concerns found by scoring the questionnaire.;Long Term goal: The participant improves quality of Life and PHQ9 Scores as seen by post scores and/or verbalization of changes       Quality of Life Scores:  Quality of Life - 03/31/18 1434      Quality of Life Scores   Health/Function Pre  19.92 %    Socioeconomic Pre  25 %    Psych/Spiritual Pre  20.43 %    Family Pre  24 %    GLOBAL Pre  21.71 %      Scores of 19 and below usually  indicate a poorer quality of life in these areas.  A difference of  2-3 points is a clinically meaningful difference.  A difference of 2-3 points in the total score of the Quality of Life Index has been associated with significant improvement in overall quality of life, self-image, physical symptoms, and general health in studies assessing change in quality of life.  PHQ-9: Recent Review Flowsheet Data    Depression screen [facility] 2/9 03/31/2018   Decreased Interest 0   Down, Depressed, Hopeless 0   PHQ - 2 Score 0   Altered sleeping 0   Tired, decreased energy 1   Change in appetite 2   Feeling bad or failure about yourself  1   Trouble concentrating 0   Moving slowly or fidgety/restless 0   Suicidal thoughts 0   PHQ-9 Score 4   Difficult doing work/chores Not difficult at all     Interpretation of Total Score  Total Score Depression Severity:  1-4 = Minimal depression, 5-9 = Mild depression, 10-14 = Moderate depression, 15-19 = Moderately severe depression, 20-27 = Severe depression   Psychosocial Evaluation and Intervention: Psychosocial Evaluation - 04/04/18 0919      Psychosocial Evaluation & Interventions   Interventions  Encouraged to exercise with the program and follow exercise prescription    Comments  Counselor met with Mr. Kullman Perlmutter) today for initial psychosocial evaluation.  He is a 77 year old who had several stents inserted due to blockages on 3/26.  He has a family history of heart disease.  Tennis has a strong support system with a spouse of 48 years; lives in a retirement community and is actively involved in his [employer].  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  Johnpaul reports sleeping well most of the time with the use of a CPAP and a medication he takes at bedtime to help with anxiety and sleep.  He states his appetite is "too good" and he has goals to  lose some weight while in this program.   Orville has some anxiety but no depression and is able to manage with exercise  and coping strategies and the sleep aid at night.  He has minimal stress in his life other than his health.  Saquan is accustomed to walking 30 minutes each day for years and has goals to be educated on his exercise limits while in this program.  He is planning a trip with his spouse to [place] mid-May and wants to be ready for this.  Arless will be followed by staff throughout the course of this program.     Expected Outcomes  Short - Cederick will meet with the dietician to address his weight loss goal.  Long - Abdel will exercise consistently and attend the educational components to recognize his exercise limits and increase his stamina.      Continue Psychosocial Services   Follow up required by staff       Psychosocial Re-Evaluation:   Psychosocial Discharge (Final Psychosocial Re-Evaluation):   Vocational Rehabilitation: Provide vocational rehab assistance to qualifying candidates.   Vocational Rehab Evaluation & Intervention: Vocational Rehab - 03/31/18 1439      Initial Vocational Rehab Evaluation & Intervention   Assessment shows need for Vocational Rehabilitation  No       Education: Education Goals: Education classes will be provided on a variety of topics geared toward better understanding of heart health and risk factor modification. Participant will state understanding/return demonstration of topics presented as noted by education test scores.  Learning Barriers/Preferences: Learning Barriers/Preferences - 03/31/18 1437      Learning Barriers/Preferences   Learning Barriers  Sight;Hearing wears glasses and a hearing aid    Learning Preferences  Individual Instruction;Verbal Instruction;Skilled Demonstration       Education Topics:  AED/CPR: - Group verbal and written instruction with the use of models to demonstrate the basic use of the AED with the  basic ABC's of resuscitation.   General Nutrition Guidelines/Fats and Fiber: -Group instruction provided by verbal, written material, models and posters to present the general guidelines for heart healthy nutrition. Gives an explanation and review of dietary fats and fiber.   Controlling Sodium/Reading Food Labels: -Group verbal and written material supporting the discussion of sodium use in heart healthy nutrition. Review and explanation with models, verbal and written materials for utilization of the food label.   Exercise Physiology & General Exercise Guidelines: - Group verbal and written instruction with models to review the exercise physiology of the cardiovascular system and associated critical values. Provides general exercise guidelines with specific guidelines to those with heart or lung disease.    Cardiac Rehab from 04/11/2018 in [facility] Cardiac and Pulmonary Rehab  Date  04/04/18  Educator  [facility]  Instruction Review Code  1- Verbalizes Understanding      Aerobic Exercise & Resistance Training: - Gives group verbal and written instruction on the various components of exercise. Focuses on aerobic and resistive training programs and the benefits of this training and how to safely progress through these programs..   Flexibility, Balance, Mind/Body Relaxation: Provides group verbal/written instruction on the benefits of flexibility and balance training, including mind/body exercise modes such as yoga, pilates and tai chi.  Demonstration and skill practice provided.   Cardiac Rehab from 04/11/2018 in [facility] Cardiac and Pulmonary Rehab  Date  04/11/18  Educator  [facility]  Instruction Review Code  1- Verbalizes Understanding      Stress and Anxiety: - Provides group verbal and written instruction about the health risks of elevated stress and causes of high stress.  Discuss the correlation between heart/lung disease and anxiety and  treatment options. Review healthy ways to manage with stress  and anxiety.   Depression: - Provides group verbal and written instruction on the correlation between heart/lung disease and depressed mood, treatment options, and the stigmas associated with seeking treatment.   Cardiac Rehab from 04/11/2018 in [facility] Cardiac and Pulmonary Rehab  Date  04/06/18  Educator  [facility]  Instruction Review Code  1- Verbalizes Understanding      Anatomy & Physiology of the Heart: - Group verbal and written instruction and models provide basic cardiac anatomy and physiology, with the coronary electrical and arterial systems. Review of Valvular disease and Heart Failure   Cardiac Procedures: - Group verbal and written instruction to review commonly prescribed medications for heart disease. Reviews the medication, class of the drug, and side effects. Includes the steps to properly store meds and maintain the prescription regimen. (beta blockers and nitrates)   Cardiac Medications I: - Group verbal and written instruction to review commonly prescribed medications for heart disease. Reviews the medication, class of the drug, and side effects. Includes the steps to properly store meds and maintain the prescription regimen.   Cardiac Medications II: -Group verbal and written instruction to review commonly prescribed medications for heart disease. Reviews the medication, class of the drug, and side effects. (all other drug classes)    Go Sex-Intimacy & Heart Disease, Get SMART - Goal Setting: - Group verbal and written instruction through game format to discuss heart disease and the return to sexual intimacy. Provides group verbal and written material to discuss and apply goal setting through the application of the S.M.A.R.T. Method.   Other Matters of the Heart: - Provides group verbal, written materials and models to describe Stable Angina and Peripheral Artery. Includes description of the disease process and treatment options available to the cardiac  patient.   Exercise & Equipment Safety: - Individual verbal instruction and demonstration of equipment use and safety with use of the equipment.   Cardiac Rehab from 04/11/2018 in [facility] Cardiac and Pulmonary Rehab  Date  03/31/18  Educator  [facility]  Instruction Review Code  1- Verbalizes Understanding      Infection Prevention: - Provides verbal and written material to individual with discussion of infection control including proper hand washing and proper equipment cleaning during exercise session.   Cardiac Rehab from 04/11/2018 in [facility] Cardiac and Pulmonary Rehab  Date  03/31/18  Educator  [facility]  Instruction Review Code  1- Verbalizes Understanding      Falls Prevention: - Provides verbal and written material to individual with discussion of falls prevention and safety.   Cardiac Rehab from 04/11/2018 in [facility] Cardiac and Pulmonary Rehab  Date  03/31/18  Educator  [facility]  Instruction Review Code  1- Verbalizes Understanding      Diabetes: - Individual verbal and written instruction to review signs/symptoms of diabetes, desired ranges of glucose level fasting, after meals and with exercise. Acknowledge that pre and post exercise glucose checks will be done for 3 sessions at entry of program.   Know Your Numbers and Risk Factors: -Group verbal and written instruction about important numbers in your health.  Discussion of what are risk factors and how they play a role in the disease process.  Review of Cholesterol, Blood Pressure, Diabetes, and BMI and the role they play in your overall health.   Sleep Hygiene: -Provides group verbal and written instruction about how sleep can affect your health.  Define sleep hygiene, discuss sleep cycles and impact of sleep habits.  Review good sleep hygiene tips.    Other: -Provides group and verbal instruction on various topics (see comments)   Knowledge Questionnaire Score: Knowledge Questionnaire Score - 03/31/18 1438      Knowledge Questionnaire  Score   Pre Score  23/28 correct answers reviewed with patient who verbalized understanding       Core Components/Risk Factors/Patient Goals at Admission: Personal Goals and Risk Factors at Admission - 03/31/18 1428      Core Components/Risk Factors/Patient Goals on Admission    Weight Management  Yes;Weight Loss    Intervention  Weight Management: Develop a combined nutrition and exercise program designed to reach desired caloric intake, while maintaining appropriate intake of nutrient and fiber, sodium and fats, and appropriate energy expenditure required for the weight goal.;Weight Management: Provide education and appropriate resources to help participant work on and attain dietary goals.;Weight Management/Obesity: Establish reasonable short term and long term weight goals.    Admit Weight  211 lb (95.7 kg)    Goal Weight: Short Term  205 lb (93 kg)    Goal Weight: Long Term  199 lb (90.3 kg)    Expected Outcomes  Short Term: Continue to assess and modify interventions until short term weight is achieved;Long Term: Adherence to nutrition and physical activity/exercise program aimed toward attainment of established weight goal;Weight Maintenance: Understanding of the daily nutrition guidelines, which includes 25-35% calories from fat, 7% or less cal from saturated fats, less than 268m cholesterol, less than 1.5gm of sodium, & 5 or more servings of fruits and vegetables daily;Weight Loss: Understanding of general recommendations for a balanced deficit meal plan, which promotes 1-2 lb weight loss per week and includes a negative energy balance of [phone] kcal/d;Understanding of distribution of calorie intake throughout the day with the consumption of 4-5 meals/snacks;Understanding recommendations for meals to include 15-35% energy as protein, 25-35% energy from fat, 35-60% energy from carbohydrates, less than 2047mof dietary cholesterol, 20-35 gm of total fiber daily    Hypertension  Yes     Intervention  Provide education on lifestyle modifcations including regular physical activity/exercise, weight management, moderate sodium restriction and increased consumption of fresh fruit, vegetables, and low fat dairy, alcohol moderation, and smoking cessation.;Monitor prescription use compliance.    Expected Outcomes  Short Term: Continued assessment and intervention until BP is < 140/9069mG in hypertensive participants. < 130/31m27m in hypertensive participants with diabetes, heart failure or chronic kidney disease.;Long Term: Maintenance of blood pressure at goal levels.    Lipids  Yes elevated triglycerides    Intervention  Provide education and support for participant on nutrition & aerobic/resistive exercise along with prescribed medications to achieve LDL <70mg52mL >40mg.77mExpected Outcomes  Short Term: Participant states understanding of desired cholesterol values and is compliant with medications prescribed. Participant is following exercise prescription and nutrition guidelines.;Long Term: Cholesterol controlled with medications as prescribed, with individualized exercise RX and with personalized nutrition plan. Value goals: LDL < 70mg, 37m> 40 mg.    Stress  Yes    Intervention  Offer individual and/or small group education and counseling on adjustment to heart disease, stress management and health-related lifestyle change. Teach and support self-help strategies.;Refer participants experiencing significant psychosocial distress to appropriate mental health specialists for further evaluation and treatment. When possible, include family members and significant others in education/counseling sessions.    Expected Outcomes  Short Term: Participant demonstrates changes in health-related behavior, relaxation and other stress management skills, ability to obtain effective social  support, and compliance with psychotropic medications if prescribed.;Long Term: Emotional wellbeing is indicated by  absence of clinically significant psychosocial distress or social isolation.    Personal Goal Other  Yes    Personal Goal  Return to playing pickle ball and bicycling.    Intervention  Will attent class work to improve stamina and endurance.     Expected Outcomes  He will be able to have enough stamina and endurance to return to playing pickle ball and go longer distances on his bike.        Core Components/Risk Factors/Patient Goals Review:    Core Components/Risk Factors/Patient Goals at Discharge (Final Review):    ITP Comments: ITP Comments    Row Name 03/31/18 1424 04/04/18 0944 04/13/18 0611       ITP Comments  Medical Review Completed; initial ITP created. Diagnosis Documentation can be found in [place] encounter dated 03/23/2018.  Aero reported jaw pain on the elliptical today.  It is the same jaw pain that he was having prior to his stents. He has continued to have jaw pain since his stents, thus is on Imdur.  Today it got to a 3/10.  We talked about making sure he lets the staff know about it and not to exceed a 6/10 before taking a rest break.   30 day review. Continue with ITP unless directed changes per Medical Director        Comments:

## 2018-04-15 ENCOUNTER — Encounter: Payer: Medicare Other | Admitting: *Deleted

## 2018-04-15 DIAGNOSIS — Z955 Presence of coronary angioplasty implant and graft: Secondary | ICD-10-CM

## 2018-04-15 DIAGNOSIS — Z7982 Long term (current) use of aspirin: Secondary | ICD-10-CM | POA: Diagnosis not present

## 2018-04-15 NOTE — Progress Notes (Signed)
Daily Session Note  Patient Details  Name: DEJION GRILLO MRN: [id number] Date of Birth: [birth year] Referring Provider:     Cardiac Rehab from 03/31/2018 in [facility] Cardiac and Pulmonary Rehab  Referring Provider  [name]      Encounter Date: 04/15/2018  Check In: Session Check In - 04/15/18 0826      Check-In   Location  [facility]-Cardiac & Pulmonary Rehab    Staff Present  Alberteen Sam, MA, RCEP, CCRP, Exercise Physiologist;Meredith Sherryll Burger, RN Vickki Hearing, BA, ACSM CEP, Exercise Physiologist    Supervising physician immediately available to respond to emergencies  See telemetry face sheet for immediately available ER MD    Medication changes reported      No    Fall or balance concerns reported     No    Warm-up and Cool-down  Performed on first and last piece of equipment    Resistance Training Performed  Yes    VAD Patient?  No      Pain Assessment   Currently in Pain?  No/denies          Social History   Tobacco Use  Smoking Status Never Smoker  Smokeless Tobacco Never Used    Goals Met:  Independence with exercise equipment Exercise tolerated well No report of cardiac concerns or symptoms Strength training completed today  Goals Unmet:  Not Applicable  Comments: Pt able to follow exercise prescription today without complaint.  Will continue to monitor for progression.    Dr. Emily Filbert is Medical Director for [place] and [facility].

## 2018-04-18 ENCOUNTER — Encounter: Payer: Medicare Other | Admitting: *Deleted

## 2018-04-18 DIAGNOSIS — Z7982 Long term (current) use of aspirin: Secondary | ICD-10-CM | POA: Diagnosis not present

## 2018-04-18 DIAGNOSIS — Z955 Presence of coronary angioplasty implant and graft: Secondary | ICD-10-CM

## 2018-04-18 NOTE — Progress Notes (Signed)
Daily Session Note  Patient Details  Name: Richard Melton MRN: [id number] Date of Birth: Apr 14, 1941 Referring Provider:     Cardiac Rehab from 03/31/2018 in [facility] Cardiac and Pulmonary Rehab  Referring Provider  [name]      Encounter Date: 04/18/2018  Check In: Session Check In - 04/18/18 0749      Check-In   Location  [facility]-Cardiac & Pulmonary Rehab    Staff Present  Alberteen Sam, MA, RCEP, CCRP, Exercise Physiologist;Kelly Amedeo Plenty, BS, ACSM CEP, Exercise Physiologist;[name], RN, BSN, CCRP    Supervising physician immediately available to respond to emergencies  See telemetry face sheet for immediately available ER MD    Medication changes reported      No    Fall or balance concerns reported     No    Warm-up and Cool-down  Performed on first and last piece of equipment    Resistance Training Performed  Yes    VAD Patient?  No      Pain Assessment   Currently in Pain?  No/denies          Social History   Tobacco Use  Smoking Status Never Smoker  Smokeless Tobacco Never Used    Goals Met:  Independence with exercise equipment Exercise tolerated well No report of cardiac concerns or symptoms Strength training completed today  Goals Unmet:  Not Applicable  Comments: Pt able to follow exercise prescription today without complaint.  Will continue to monitor for progression.    Dr. Emily Filbert is Medical Director for [place] and [facility].

## 2018-04-20 ENCOUNTER — Encounter: Payer: Medicare Other | Attending: Cardiology

## 2018-04-20 DIAGNOSIS — I10 Essential (primary) hypertension: Secondary | ICD-10-CM | POA: Diagnosis not present

## 2018-04-20 DIAGNOSIS — Z955 Presence of coronary angioplasty implant and graft: Secondary | ICD-10-CM

## 2018-04-20 DIAGNOSIS — E785 Hyperlipidemia, unspecified: Secondary | ICD-10-CM | POA: Insufficient documentation

## 2018-04-20 DIAGNOSIS — Z79899 Other long term (current) drug therapy: Secondary | ICD-10-CM | POA: Insufficient documentation

## 2018-04-20 DIAGNOSIS — Z7982 Long term (current) use of aspirin: Secondary | ICD-10-CM | POA: Insufficient documentation

## 2018-04-20 NOTE — Progress Notes (Signed)
Daily Session Note  Patient Details  Name: Richard Melton MRN: [id number] Date of Birth: Mar 13, 1941 Referring Provider:     Cardiac Rehab from 03/31/2018 in [facility] Cardiac and Pulmonary Rehab  Referring Provider  [name]      Encounter Date: 04/20/2018  Check In: Session Check In - 04/20/18 0744      Check-In   Location  [facility]-Cardiac & Pulmonary Rehab    Staff Present  Justin Mend RCP,RRT,BSRT;Heath Lark, RN, BSN, CCRP;Jessica Luan Pulling, MA, RCEP, CCRP, Exercise Physiologist    Supervising physician immediately available to respond to emergencies  See telemetry face sheet for immediately available ER MD    Medication changes reported      No    Fall or balance concerns reported     No    Tobacco Cessation  No Change    Warm-up and Cool-down  Performed on first and last piece of equipment    Resistance Training Performed  Yes    VAD Patient?  No      Pain Assessment   Currently in Pain?  No/denies          Social History   Tobacco Use  Smoking Status Never Smoker  Smokeless Tobacco Never Used    Goals Met:  Independence with exercise equipment Exercise tolerated well No report of cardiac concerns or symptoms Strength training completed today  Goals Unmet:  Not Applicable  Comments: Pt able to follow exercise prescription today without complaint.  Will continue to monitor for progression.   Dr. Emily Filbert is Medical Director for [place] and [facility].

## 2018-04-22 ENCOUNTER — Encounter: Payer: Medicare Other | Admitting: *Deleted

## 2018-04-22 DIAGNOSIS — Z955 Presence of coronary angioplasty implant and graft: Secondary | ICD-10-CM

## 2018-04-22 DIAGNOSIS — Z7982 Long term (current) use of aspirin: Secondary | ICD-10-CM | POA: Diagnosis not present

## 2018-04-22 NOTE — Progress Notes (Signed)
Daily Session Note  Patient Details  Name: Richard Melton MRN: [id number] Date of Birth: 1941/05/11 Referring Provider:     Cardiac Rehab from 03/31/2018 in [facility] Cardiac and Pulmonary Rehab  Referring Provider  [name]      Encounter Date: 04/22/2018  Check In: Session Check In - 04/22/18 1037      Check-In   Location  [facility]-Cardiac & Pulmonary Rehab    Staff Present  Renita Papa, RN BSN;Jessica Luan Pulling, MA, RCEP, CCRP, Exercise Physiologist;Amanda Oletta Darter, [place], ACSM CEP, Exercise Physiologist    Supervising physician immediately available to respond to emergencies  See telemetry face sheet for immediately available ER MD    Medication changes reported      No    Fall or balance concerns reported     No    Warm-up and Cool-down  Performed on first and last piece of equipment    Resistance Training Performed  Yes    VAD Patient?  No      Pain Assessment   Currently in Pain?  No/denies          Social History   Tobacco Use  Smoking Status Never Smoker  Smokeless Tobacco Never Used    Goals Met:  Independence with exercise equipment Exercise tolerated well Personal goals reviewed No report of cardiac concerns or symptoms Strength training completed today  Goals Unmet:  Not Applicable  Comments: Pt able to follow exercise prescription today without complaint.  Will continue to monitor for progression. See ITP for goal review   Dr. Emily Filbert is Medical Director for [place] and [facility].

## 2018-04-25 ENCOUNTER — Encounter: Payer: Medicare Other | Admitting: *Deleted

## 2018-04-25 DIAGNOSIS — Z955 Presence of coronary angioplasty implant and graft: Secondary | ICD-10-CM

## 2018-04-25 DIAGNOSIS — Z7982 Long term (current) use of aspirin: Secondary | ICD-10-CM | POA: Diagnosis not present

## 2018-04-25 NOTE — Progress Notes (Signed)
Daily Session Note  Patient Details  Name: Richard Melton MRN: [id number] Date of Birth: 1941-11-11 Referring Provider:     Cardiac Rehab from 03/31/2018 in [facility] Cardiac and Pulmonary Rehab  Referring Provider  [name]      Encounter Date: 04/25/2018  Check In: Session Check In - 04/25/18 0803      Check-In   Location  [facility]-Cardiac & Pulmonary Rehab    Staff Present  Heath Lark, RN, BSN, CCRP;Leightyn Cina [place], MA, RCEP, CCRP, Exercise Physiologist;Kelly Amedeo Plenty, BS, ACSM CEP, Exercise Physiologist    Supervising physician immediately available to respond to emergencies  See telemetry face sheet for immediately available ER MD    Medication changes reported      No    Fall or balance concerns reported     No    Warm-up and Cool-down  Performed on first and last piece of equipment    Resistance Training Performed  Yes    VAD Patient?  No      Pain Assessment   Currently in Pain?  No/denies          Social History   Tobacco Use  Smoking Status Never Smoker  Smokeless Tobacco Never Used    Goals Met:  Independence with exercise equipment Personal goals reviewed No report of cardiac concerns or symptoms Strength training completed today  Goals Unmet:  Not Applicable  Comments: Pt able to follow exercise prescription today without complaint.  Will continue to monitor for progression.    Dr. Emily Filbert is Medical Director for [place] and [facility].

## 2018-04-27 DIAGNOSIS — Z7982 Long term (current) use of aspirin: Secondary | ICD-10-CM | POA: Diagnosis not present

## 2018-04-27 DIAGNOSIS — Z955 Presence of coronary angioplasty implant and graft: Secondary | ICD-10-CM

## 2018-04-27 NOTE — Progress Notes (Signed)
Daily Session Note  Patient Details  Name: Richard Melton MRN: [id number] Date of Birth: 12/05/1941 Referring Provider:     Cardiac Rehab from 03/31/2018 in [facility] Cardiac and Pulmonary Rehab  Referring Provider  [name]      Encounter Date: 04/27/2018  Check In: Session Check In - 04/27/18 0807      Check-In   Location  [facility]-Cardiac & Pulmonary Rehab    Staff Present  Joseph Hood RCP,RRT,BSRT;[name], RN, BSN, CCRP;Jessica Hawkins, MA, RCEP, CCRP, Exercise Physiologist    Supervising physician immediately available to respond to emergencies  See telemetry face sheet for immediately available ER MD    Medication changes reported      No    Fall or balance concerns reported     No    Tobacco Cessation  No Change    Warm-up and Cool-down  Performed on first and last piece of equipment    Resistance Training Performed  Yes    VAD Patient?  No      Pain Assessment   Currently in Pain?  No/denies          Social History   Tobacco Use  Smoking Status Never Smoker  Smokeless Tobacco Never Used    Goals Met:  Independence with exercise equipment Exercise tolerated well No report of cardiac concerns or symptoms Strength training completed today  Goals Unmet:  Not Applicable  Comments: Pt able to follow exercise prescription today without complaint.  Will continue to monitor for progression.   Dr. Mark Miller is Medical Director for [facility] and [facility]. 

## 2018-05-11 ENCOUNTER — Encounter: Payer: Self-pay | Admitting: *Deleted

## 2018-05-11 DIAGNOSIS — Z955 Presence of coronary angioplasty implant and graft: Secondary | ICD-10-CM

## 2018-05-11 NOTE — Progress Notes (Signed)
Cardiac Individual Treatment Plan  Patient Details  Name: Richard Melton MRN: [id number] Date of Birth: 03-31-1941 Referring Provider:     Cardiac Rehab from 03/31/2018 in [facility] Cardiac and Pulmonary Rehab  Referring Provider  [name]      Initial Encounter Date:    Cardiac Rehab from 03/31/2018 in [facility] Cardiac and Pulmonary Rehab  Date  03/31/18  Referring Provider  [name]      Visit Diagnosis: Status post coronary artery stent placement  Patient's Home Medications on Admission:  Current Outpatient Medications:  .  acetaminophen (TYLENOL) 500 MG tablet, Take 500 mg by mouth every 6 (six) hours as needed for moderate pain or headache., Disp: , Rfl:  .  amLODipine (NORVASC) 2.5 MG tablet, Take 2.5 mg by mouth every evening. , Disp: , Rfl:  .  aspirin EC 81 MG tablet, Take 81 mg by mouth daily., Disp: , Rfl:  .  clopidogrel (PLAVIX) 75 MG tablet, Take 1 tablet (75 mg total) by mouth daily., Disp: 30 tablet, Rfl: 11 .  esomeprazole (NEXIUM) 40 MG capsule, Take 40 mg by mouth daily. , Disp: , Rfl:  .  fexofenadine (ALLEGRA) 180 MG tablet, Take 180 mg by mouth every evening. , Disp: , Rfl:  .  hyoscyamine (LEVSIN, ANASPAZ) 0.125 MG tablet, Take 0.125 mg by mouth every 4 (four) hours as needed for cramping. , Disp: , Rfl:  .  isosorbide mononitrate (IMDUR) 30 MG 24 hr tablet, , Disp: , Rfl: 11 .  Melatonin 3 MG TABS, Take 3 mg by mouth at bedtime. , Disp: , Rfl:  .  mirtazapine (REMERON) 15 MG tablet, Take 7.5 mg by mouth at bedtime., Disp: , Rfl:  .  mometasone (ELOCON) 0.1 % lotion, Apply 4 drops topically daily as needed (for ear itch)., Disp: , Rfl:  .  Multiple Vitamins-Minerals (CENTRUM SILVER PO), Take 1 tablet by mouth daily., Disp: , Rfl:  .  olmesartan (BENICAR) 40 MG tablet, Take 40 mg by mouth daily., Disp: , Rfl:  .  Omega-3 Fatty Acids (OMEGA 3 PO), Take 1 capsule by mouth 2 (two) times daily. , Disp: , Rfl:  .  Probiotic Product (ALIGN) 4 MG CAPS, Take 8 mg by mouth  daily. , Disp: , Rfl:  .  rosuvastatin (CRESTOR) 10 MG tablet, Take 10 mg by mouth daily., Disp: , Rfl:  .  sodium chloride (OCEAN) 0.65 % SOLN nasal spray, Place 1-2 sprays into both nostrils as needed for congestion., Disp: , Rfl:   Past Medical History: Past Medical History:  Diagnosis Date  . Allergic   . Anxiety   . Cancer [facility])    Prostate  . Chronic insomnia   . Headache    Migraines  . Hyperlipidemia   . Hypertension   . Hypertriglyceridemia   . IBS (irritable bowel syndrome)   . Psoriasis   . Sleep apnea     Tobacco Use: Social History   Tobacco Use  Smoking Status Never Smoker  Smokeless Tobacco Never Used    Labs: Recent Review Flowsheet Data    There is no flowsheet data to display.       Exercise Target Goals:    Exercise Program Goal: Individual exercise prescription set using results from initial 6 min walk test and THRR while considering  patient's activity barriers and safety.   Exercise Prescription Goal: Initial exercise prescription builds to 30-45 minutes a day of aerobic activity, 2-3 days per week.  Home exercise guidelines will be given to  patient during program as part of exercise prescription that the participant will acknowledge.  Activity Barriers & Risk Stratification: Activity Barriers & Cardiac Risk Stratification - 03/31/18 1435      Activity Barriers & Cardiac Risk Stratification   Activity Barriers  Back Problems;Joint Problems    Cardiac Risk Stratification  High       6 Minute Walk: 6 Minute Walk    Row Name 03/31/18 1446         6 Minute Walk   Distance  1542 feet     Walk Time  6 minutes     # of Rest Breaks  0     MPH  2.92     METS  3.18     RPE  11     Perceived Dyspnea   0     VO2 Peak  11.12     Symptoms  Yes (comment)     Comments  jaw pain2/10 that resolved with rest - has spoken with Dr - not a new s/s     Resting HR  60 bpm     Resting BP  138/60     Resting Oxygen Saturation   98 %     Exercise  Oxygen Saturation  during 6 min walk  99 %     Max Ex. HR  96 bpm     Max Ex. BP  142/66     2 Minute Post BP  118/56        Oxygen Initial Assessment: Oxygen Initial Assessment - 03/31/18 1440      Home Oxygen   Sleep Oxygen Prescription  CPAP    Liters per minute  2       Oxygen Re-Evaluation:   Oxygen Discharge (Final Oxygen Re-Evaluation):   Initial Exercise Prescription: Initial Exercise Prescription - 03/31/18 1400      Date of Initial Exercise RX and Referring Provider   Date  03/31/18    Referring Provider  [name]      Treadmill   MPH  2.8    Grade  0    Minutes  15    METs  3.14      Arm Ergometer   Level  2    Watts  50    RPM  40    Minutes  15    METs  3      Recumbant Elliptical   Level  3    RPM  50    Minutes  15    METs  3      Prescription Details   Frequency (times per week)  3    Duration  Progress to 45 minutes of aerobic exercise without signs/symptoms of physical distress      Intensity   THRR 40-80% of Max Heartrate  94-128    Ratings of Perceived Exertion  11-15    Perceived Dyspnea  0-4      Resistance Training   Training Prescription  Yes    Weight  4 lb    Reps  10-15       Perform Capillary Blood Glucose checks as needed.  Exercise Prescription Changes: Exercise Prescription Changes    Row Name 03/31/18 1400 04/06/18 1500 04/08/18 0800 04/20/18 1400 05/02/18 1500     Response to Exercise   Blood Pressure (Admit)  138/60  142/78  -  134/74  122/74   Blood Pressure (Exercise)  142/66  142/68  -  144/70  138/70   Blood Pressure (  Exit)  118/56  124/74  -  114/60  122/74   Heart Rate (Admit)  69 bpm  59 bpm  -  70 bpm  55 bpm   Heart Rate (Exercise)  96 bpm  112 bpm  -  129 bpm  99 bpm   Heart Rate (Exit)  64 bpm  66 bpm  -  68 bpm  61 bpm   Oxygen Saturation (Admit)  99 %  -  -  -  -   Oxygen Saturation (Exercise)  98 %  -  -  -  -   Rating of Perceived Exertion (Exercise)  11  13  -  13  12   Symptoms  -  none   -  none  none   Comments  -  first full day of exercise  -  -  -   Duration  -  Progress to 45 minutes of aerobic exercise without signs/symptoms of physical distress  -  Continue with 45 min of aerobic exercise without signs/symptoms of physical distress.  Continue with 45 min of aerobic exercise without signs/symptoms of physical distress.   Intensity  -  THRR unchanged  -  THRR unchanged  THRR unchanged     Progression   Progression  -  Continue to progress workloads to maintain intensity without signs/symptoms of physical distress.  -  Continue to progress workloads to maintain intensity without signs/symptoms of physical distress.  Continue to progress workloads to maintain intensity without signs/symptoms of physical distress.   Average METs  -  2.77  -  3.02  3.12     Resistance Training   Training Prescription  -  Yes  -  Yes  Yes   Weight  -  4 lbs  -  4 lbs  4 lbs   Reps  -  10-15  -  10-15  10-15     Interval Training   Interval Training  -  No  -  No  No     Treadmill   MPH  -  2.8  -  2.8  2.8   Grade  -  0  -  1  1   Minutes  -  15  -  15  15   METs  -  3.14  -  3.53  3.53     Arm Ergometer   Level  -  1  -  1  1   Minutes  -  15  -  15  15   METs  -  2.4  -  2.5  2.7     Elliptical   Level  -  -  -  1  1   Speed  -  -  -  3.2  3.2   Minutes  -  -  -  15  15     Home Exercise Plan   Plans to continue exercise at  -  -  [employer] (comment) [employer] and walking  [employer] (comment) [place] and walking  [employer] (comment) [employer] and walking   Frequency  -  -  Add 2 additional days to program exercise sessions.  Add 2 additional days to program exercise sessions.  Add 2 additional days to program exercise sessions.   Initial Home Exercises Provided  -  -  04/08/18  04/08/18  04/08/18      Exercise Comments: Exercise Comments    Row Name 04/04/18  0800 04/04/18 0945         Exercise Comments  :First full day of  exercise!  Patient was oriented to gym and equipment including functions, settings, policies, and procedures.  Patient's individual exercise prescription and treatment plan were reviewed.  All starting workloads were established based on the results of the 6 minute walk test done at initial orientation visit.  The plan for exercise progression was also introduced and progression will be customized based on patient's performance and goals.  Sade reported jaw pain on the elliptical today.  It is the same jaw pain that he was having prior to his stents. He has continued to have jaw pain since his stents, thus is on Imdur.  Today it got to a 3/10.  We talked about making sure he lets the staff know about it and not to exceed a 6/10 before taking a rest break.          Exercise Goals and Review: Exercise Goals    Row Name 03/31/18 1445             Exercise Goals   Increase Physical Activity  Yes       Intervention  Provide advice, education, support and counseling about physical activity/exercise needs.;Develop an individualized exercise prescription for aerobic and resistive training based on initial evaluation findings, risk stratification, comorbidities and participant's personal goals.       Expected Outcomes  Short Term: Attend rehab on a regular basis to increase amount of physical activity.;Long Term: Add in home exercise to make exercise part of routine and to increase amount of physical activity.;Long Term: Exercising regularly at least 3-5 days a week.       Increase Strength and Stamina  Yes       Intervention  Provide advice, education, support and counseling about physical activity/exercise needs.;Develop an individualized exercise prescription for aerobic and resistive training based on initial evaluation findings, risk stratification, comorbidities and participant's personal goals.       Expected Outcomes  Short Term: Increase workloads from initial exercise prescription for resistance,  speed, and METs.;Short Term: Perform resistance training exercises routinely during rehab and add in resistance training at home;Long Term: Improve cardiorespiratory fitness, muscular endurance and strength as measured by increased METs and functional capacity (6MWT)       Able to understand and use rate of perceived exertion (RPE) scale  Yes       Intervention  Provide education and explanation on how to use RPE scale       Expected Outcomes  Short Term: Able to use RPE daily in rehab to express subjective intensity level;Long Term:  Able to use RPE to guide intensity level when exercising independently       Able to understand and use Dyspnea scale  Yes       Intervention  Provide education and explanation on how to use Dyspnea scale       Expected Outcomes  Short Term: Able to use Dyspnea scale daily in rehab to express subjective sense of shortness of breath during exertion;Long Term: Able to use Dyspnea scale to guide intensity level when exercising independently       Knowledge and understanding of Target Heart Rate Range (THRR)  Yes       Intervention  Provide education and explanation of THRR including how the numbers were predicted and where they are located for reference       Expected Outcomes  Short Term: Able to state/look up THRR;Short  Term: Able to use daily as guideline for intensity in rehab;Long Term: Able to use THRR to govern intensity when exercising independently       Able to check pulse independently  Yes       Intervention  Provide education and demonstration on how to check pulse in carotid and radial arteries.;Review the importance of being able to check your own pulse for safety during independent exercise       Expected Outcomes  Short Term: Able to explain why pulse checking is important during independent exercise;Long Term: Able to check pulse independently and accurately       Understanding of Exercise Prescription  Yes       Intervention  Provide education,  explanation, and written materials on patient's individual exercise prescription       Expected Outcomes  Short Term: Able to explain program exercise prescription;Long Term: Able to explain home exercise prescription to exercise independently          Exercise Goals Re-Evaluation : Exercise Goals Re-Evaluation    Row Name 04/04/18 0801 04/08/18 0857 04/20/18 1450 04/22/18 1038 05/02/18 1510     Exercise Goal Re-Evaluation   Exercise Goals Review  Understanding of Exercise Prescription;Knowledge and understanding of Target Heart Rate Range (THRR);Able to understand and use rate of perceived exertion (RPE) scale;Increase Strength and Stamina;Increase Physical Activity  Increase Physical Activity;Able to understand and use rate of perceived exertion (RPE) scale;Knowledge and understanding of Target Heart Rate Range (THRR);Understanding of Exercise Prescription;Able to check pulse independently;Increase Strength and Stamina  Increase Physical Activity;Understanding of Exercise Prescription;Increase Strength and Stamina  Increase Physical Activity;Understanding of Exercise Prescription;Increase Strength and Stamina  Increase Physical Activity;Understanding of Exercise Prescription;Increase Strength and Stamina   Comments  Reviewed RPE scale, THR and program prescription with pt today.  Pt voiced understanding and was given a copy of goals to take home.   Reviewed home exercise with pt today.  Pt plans to walk and go to gym at [facility] for exercise.  Reviewed THR, pulse, RPE, sign and symptoms, and when to call 911 or MD.  Also discussed weather considerations and indoor options.  Pt voiced understanding.  Morty has been doing well in rehab.  He has taken to the elliptical and is now getting the full 15 min on there.  We will start to increase his workloads more.  We will continue to monitor his progression.   Axil continues to do well in rehab.  He feels that it is going good so far.  He has been walking  at home for 30 min 2-3 days a week.  He has also been able to get back into his pickleball games on Tuesdays and Thursdays.  He can tell that his stamina is improving as he is no longer getting as winded, still get tired but able to play for two hours versus just one hour.  He is please with his progress so far.  We will continue to work with him on building his strength and stamina.   Mavric has been doing well in rehab.  He will be out the rest of the month with his trip to [place].  He plans to do some walking while he is there and will return to finish the program in June.     Expected Outcomes  Short: Use RPE daily to regulate intensity.  Long: Follow program prescription in THR.  Short: Start to add in at least two extra days a week at the gym starting  in two weeks.  Long: Continue to exercise indep  Short: Increase workload on arm crank and treadmill.  Long: Continue to add in exercise at gym on off days.   Short: Increase workload on arm crank and treadmill.  Long: Continue to add in exercise at gym and walking at home on off days.   Short: Enjoy trip.  Long: Return to rehab to finish.       Discharge Exercise Prescription (Final Exercise Prescription Changes): Exercise Prescription Changes - 05/02/18 1500      Response to Exercise   Blood Pressure (Admit)  122/74    Blood Pressure (Exercise)  138/70    Blood Pressure (Exit)  122/74    Heart Rate (Admit)  55 bpm    Heart Rate (Exercise)  99 bpm    Heart Rate (Exit)  61 bpm    Rating of Perceived Exertion (Exercise)  12    Symptoms  none    Duration  Continue with 45 min of aerobic exercise without signs/symptoms of physical distress.    Intensity  THRR unchanged      Progression   Progression  Continue to progress workloads to maintain intensity without signs/symptoms of physical distress.    Average METs  3.12      Resistance Training   Training Prescription  Yes    Weight  4 lbs    Reps  10-15      Interval Training   Interval  Training  No      Treadmill   MPH  2.8    Grade  1    Minutes  15    METs  3.53      Arm Ergometer   Level  1    Minutes  15    METs  2.7      Elliptical   Level  1    Speed  3.2    Minutes  15      Home Exercise Plan   Plans to continue exercise at  [employer] (comment) [place] and walking    Frequency  Add 2 additional days to program exercise sessions.    Initial Home Exercises Provided  04/08/18       Nutrition:  Target Goals: Understanding of nutrition guidelines, daily intake of sodium <1515m, cholesterol <2087m calories 30% from fat and 7% or less from saturated fats, daily to have 5 or more servings of fruits and vegetables.  Biometrics: Pre Biometrics - 03/31/18 1444      Pre Biometrics   Height  6' 1.4" (1.864 m)    Weight  211 lb 1.6 oz (95.8 kg)    Waist Circumference  41.5 inches    Hip Circumference  40 inches    Waist to Hip Ratio  1.04 %    BMI (Calculated)  27.56        Nutrition Therapy Plan and Nutrition Goals: Nutrition Therapy & Goals - 04/18/18 1006      Nutrition Therapy   Diet  TLC/ Heart Healthy    Protein (specify units)  9oz    Fiber  35 grams    Whole Grain Foods  3 servings    Saturated Fats  16 max. grams    Fruits and Vegetables  6 servings/day 8 ideal    Sodium  2000 grams      Personal Nutrition Goals   Nutrition Goal  Continue to practice choosing lower-sodium food and beverage options. Try not to add salt to meals after  cooking, choose salt-free seasonings instead    Personal Goal #2  Practice choosing foods from different food groups when puting together a lunch meal, for example: a lean protein + 1 serving of heart healthy fat + a starch (baked potato, rice, vegetble, fruit). include dairy when possible    Personal Goal #3  Choose foods high in fiber, including fruits, vegetables, whole grains, beans and skins of foods like potatoes    Comments  He and his wife have been working towards eating a  heart-healthy diet. They typically do not cook with salt and look for heart-healthy options when eating out. He does still add salt to meals on occasion      Intervention Plan   Intervention  Prescribe, educate and counsel regarding individualized specific dietary modifications aiming towards targeted core components such as weight, hypertension, lipid management, diabetes, heart failure and other comorbidities.    Expected Outcomes  Short Term Goal: Understand basic principles of dietary content, such as calories, fat, sodium, cholesterol and nutrients.;Long Term Goal: Adherence to prescribed nutrition plan.;Short Term Goal: A plan has been developed with personal nutrition goals set during dietitian appointment.       Nutrition Assessments: Nutrition Assessments - 03/31/18 1430      MEDFICTS Scores   Pre Score  30       Nutrition Goals Re-Evaluation: Nutrition Goals Re-Evaluation    [place] Name 04/18/18 1011 04/22/18 1045           Goals   Nutrition Goal  Practice choosing foods from different food groups when puting together a lunch meal, for example: a lean protein + 1 serving of heart healthy fat + a starch (baked potato, rice, vegetble, fruit). include dairy when possible  Practice choosing foods from different food groups when puting together a lunch meal, for example: a lean protein + 1 serving of heart healthy fat + a starch (baked potato, rice, vegetble, fruit). include dairy when possible; Higher fiber, Lower sodium      Comment  He is in the process of making his lunch meal larger than dinner to help manage his IBS but is struggling to find options and variety  Demetrion enjoyed meeting with Lattie Haw earlier this week.  He said that he learned a lot from her and will start to use her suggestions in finding more fiber and lower sodium options.       Expected Outcome  He will choose lunch options that are low in sodium and that contain foods from various food groups with a heart-healthy  diet in mind  Short: He will choose lunch options that are low in sodium and that contain foods from various food groups with a heart-healthy diet in mind  Long: Continue to follow recommendations        Personal Goal #2 Re-Evaluation   Personal Goal #2  Choose foods high in fiber including fruits, vegetables, whole grains, beans and skins of foods like potatoes  -        Personal Goal #3 Re-Evaluation   Personal Goal #3  Continue to practice choosing lower-sodim food and beverage options. Try not to add salt to meals after cooking, choose salt-free seasonings instead  -         Nutrition Goals Discharge (Final Nutrition Goals Re-Evaluation): Nutrition Goals Re-Evaluation - 04/22/18 1045      Goals   Nutrition Goal  Practice choosing foods from different food groups when puting together a lunch meal, for example: a lean protein +  1 serving of heart healthy fat + a starch (baked potato, rice, vegetble, fruit). include dairy when possible; Higher fiber, Lower sodium    Comment  Shawnmichael enjoyed meeting with Lattie Haw earlier this week.  He said that he learned a lot from her and will start to use her suggestions in finding more fiber and lower sodium options.     Expected Outcome  Short: He will choose lunch options that are low in sodium and that contain foods from various food groups with a heart-healthy diet in mind  Long: Continue to follow recommendations       Psychosocial: Target Goals: Acknowledge presence or absence of significant depression and/or stress, maximize coping skills, provide positive support system. Participant is able to verbalize types and ability to use techniques and skills needed for reducing stress and depression.   Initial Review & Psychosocial Screening: Initial Psych Review & Screening - 03/31/18 1431      Initial Review   Current issues with  Current Anxiety/Panic;Current Sleep Concerns;Current Stress Concerns    Source of Stress Concerns  Chronic Illness;Unable to  participate in former interests or hobbies    Comments  He had prostate CA in 2008 and still stresses about a relapse of cancer of some type. States they have undergone some stress from a move 2 years ago from [place] area to [facility] and still are struggling to find their place here. He also struggles some socially and has stress since his event being able to be as active as he once was.       Family Dynamics   Good Support System?  Yes    Comments  wife and [place] community      Barriers   Psychosocial barriers to participate in program  The patient should benefit from training in stress management and relaxation.      Screening Interventions   Interventions  Encouraged to exercise;Program counselor consult    Expected Outcomes  Short Term goal: Utilizing psychosocial counselor, staff and physician to assist with identification of specific Stressors or current issues interfering with healing process. Setting desired goal for each stressor or current issue identified.;Long Term Goal: Stressors or current issues are controlled or eliminated.;Short Term goal: Identification and review with participant of any Quality of Life or Depression concerns found by scoring the questionnaire.;Long Term goal: The participant improves quality of Life and PHQ9 Scores as seen by post scores and/or verbalization of changes       Quality of Life Scores:  Quality of Life - 03/31/18 1434      Quality of Life Scores   Health/Function Pre  19.92 %    Socioeconomic Pre  25 %    Psych/Spiritual Pre  20.43 %    Family Pre  24 %    GLOBAL Pre  21.71 %      Scores of 19 and below usually indicate a poorer quality of life in these areas.  A difference of  2-3 points is a clinically meaningful difference.  A difference of 2-3 points in the total score of the Quality of Life Index has been associated with significant improvement in overall quality of life, self-image, physical symptoms, and general health in studies assessing  change in quality of life.  PHQ-9: Recent Review Flowsheet Data    Depression screen [facility] 2/9 03/31/2018   Decreased Interest 0   Down, Depressed, Hopeless 0   PHQ - 2 Score 0   Altered sleeping 0   Tired, decreased energy  1   Change in appetite 2   Feeling bad or failure about yourself  1   Trouble concentrating 0   Moving slowly or fidgety/restless 0   Suicidal thoughts 0   PHQ-9 Score 4   Difficult doing work/chores Not difficult at all     Interpretation of Total Score  Total Score Depression Severity:  1-4 = Minimal depression, 5-9 = Mild depression, 10-14 = Moderate depression, 15-19 = Moderately severe depression, 20-27 = Severe depression   Psychosocial Evaluation and Intervention: Psychosocial Evaluation - 04/04/18 0919      Psychosocial Evaluation & Interventions   Interventions  Encouraged to exercise with the program and follow exercise prescription    Comments  Counselor met with Mr. Bistline Coutts) today for initial psychosocial evaluation.  He is a 77 year old who had several stents inserted due to blockages on 3/26.  He has a family history of heart disease.  Alexx has a strong support system with a spouse of 59 years; lives in a retirement community and is actively involved in his [employer].  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  Nestor reports sleeping well most of the time with the use of a CPAP and a medication he takes at bedtime to help with anxiety and sleep.  He states his appetite is "too good" and he has goals to lose some weight while in this program.   Merick has some anxiety but no depression and is able to manage with exercise and coping strategies and the sleep aid at night.  He has minimal stress in his life other than his health.  Rodricus is accustomed to walking 30 minutes each day for years and has goals to be educated on his exercise limits  while in this program.  He is planning a trip with his spouse to [place] mid-May and wants to be ready for this.  Jarold will be followed by staff throughout the course of this program.     Expected Outcomes  Short - Davidmichael will meet with the dietician to address his weight loss goal.  Long - Kierre will exercise consistently and attend the educational components to recognize his exercise limits and increase his stamina.      Continue Psychosocial Services   Follow up required by staff       Psychosocial Re-Evaluation: Psychosocial Re-Evaluation    Benson Name 04/22/18 1047             Psychosocial Re-Evaluation   Current issues with  Current Stress Concerns       Comments  Bless is looking forward to his [place] trip!!  He will be gone for three weeks and hopes not to lose everything he has already gained.  He is doing well mentally and continues to try not to dwell on everything that has happened so far.  He was just shocked about how everything had happened to him, even though now looking back he should have been on the lookout given his strong family history.  He continues to sleep well with all of his medications and found that couting backwards really helps ease him to sleep.        Expected Outcomes  Short: Enjoy his [place] trip!!  Long: Continue to cope with his heart disease  postively.        Interventions  Stress management education;Encouraged to attend Cardiac Rehabilitation  for the exercise       Continue Psychosocial Services   Follow up required by staff       Comments  He had prostate CA in 2008 and still stresses about a relapse of cancer of some type. States they have undergone some stress from a move [place] years ago from [place] area to [facility] and still are struggling to find their place here. He also struggles some socially and has stress since his event being able to be as active as he once was.          Initial Review   Source of Stress Concerns  Chronic Illness;Unable to participate in  former interests or hobbies          Psychosocial Discharge (Final Psychosocial Re-Evaluation): Psychosocial Re-Evaluation - 04/22/18 1047      Psychosocial Re-Evaluation   Current issues with  Current Stress Concerns    Comments  Kemauri is looking forward to his [place] trip!!  He will be gone for three weeks and hopes not to lose everything he has already gained.  He is doing well mentally and continues to try not to dwell on everything that has happened so far.  He was just shocked about how everything had happened to him, even though now looking back he should have been on the lookout given his strong family history.  He continues to sleep well with all of his medications and found that couting backwards really helps ease him to sleep.     Expected Outcomes  Short: Enjoy his [place] trip!!  Long: Continue to cope with his heart disease  postively.     Interventions  Stress management education;Encouraged to attend Cardiac Rehabilitation for the exercise    Continue Psychosocial Services   Follow up required by staff    Comments  He had prostate CA in 2008 and still stresses about a relapse of cancer of some type. States they have undergone some stress from a move 2 years ago from [place] area to [facility] and still are struggling to find their place here. He also struggles some socially and has stress since his event being able to be as active as he once was.       Initial Review   Source of Stress Concerns  Chronic Illness;Unable to participate in former interests or hobbies       Vocational Rehabilitation: Provide vocational rehab assistance to qualifying candidates.   Vocational Rehab Evaluation & Intervention: Vocational Rehab - 03/31/18 1439      Initial Vocational Rehab Evaluation & Intervention   Assessment shows need for Vocational Rehabilitation  No       Education: Education Goals: Education classes will be provided on a variety of topics geared toward better understanding of heart  health and risk factor modification. Participant will state understanding/return demonstration of topics presented as noted by education test scores.  Learning Barriers/Preferences: Learning Barriers/Preferences - 03/31/18 1437      Learning Barriers/Preferences   Learning Barriers  Sight;Hearing wears glasses and a hearing aid    Learning Preferences  Individual Instruction;Verbal Instruction;Skilled Demonstration       Education Topics:  AED/CPR: - Group verbal and written instruction with the use of models to demonstrate the basic use of the AED with the basic ABC's of resuscitation.   General Nutrition Guidelines/Fats and Fiber: -Group instruction provided by verbal, written material, models and posters to present the general guidelines for heart healthy nutrition. Gives an explanation and review of dietary fats  and fiber.   Controlling Sodium/Reading Food Labels: -Group verbal and written material supporting the discussion of sodium use in heart healthy nutrition. Review and explanation with models, verbal and written materials for utilization of the food label.   Exercise Physiology & General Exercise Guidelines: - Group verbal and written instruction with models to review the exercise physiology of the cardiovascular system and associated critical values. Provides general exercise guidelines with specific guidelines to those with heart or lung disease.    Cardiac Rehab from 04/27/2018 in [facility] Cardiac and Pulmonary Rehab  Date  04/04/18  Educator  [facility]  Instruction Review Code  1- Verbalizes Understanding      Aerobic Exercise & Resistance Training: - Gives group verbal and written instruction on the various components of exercise. Focuses on aerobic and resistive training programs and the benefits of this training and how to safely progress through these programs..   Flexibility, Balance, Mind/Body Relaxation: Provides group verbal/written instruction on the benefits of  flexibility and balance training, including mind/body exercise modes such as yoga, pilates and tai chi.  Demonstration and skill practice provided.   Cardiac Rehab from 04/27/2018 in [facility] Cardiac and Pulmonary Rehab  Date  04/11/18  Educator  [facility]  Instruction Review Code  1- Verbalizes Understanding      Stress and Anxiety: - Provides group verbal and written instruction about the health risks of elevated stress and causes of high stress.  Discuss the correlation between heart/lung disease and anxiety and treatment options. Review healthy ways to manage with stress and anxiety.   Cardiac Rehab from 04/27/2018 in [facility] Cardiac and Pulmonary Rehab  Date  04/20/18  Educator  [facility]  Instruction Review Code  1- Verbalizes Understanding      Depression: - Provides group verbal and written instruction on the correlation between heart/lung disease and depressed mood, treatment options, and the stigmas associated with seeking treatment.   Cardiac Rehab from 04/27/2018 in [facility] Cardiac and Pulmonary Rehab  Date  04/06/18  Educator  [facility]  Instruction Review Code  1- Verbalizes Understanding      Anatomy & Physiology of the Heart: - Group verbal and written instruction and models provide basic cardiac anatomy and physiology, with the coronary electrical and arterial systems. Review of Valvular disease and Heart Failure   Cardiac Rehab from 04/27/2018 in [facility] Cardiac and Pulmonary Rehab  Date  04/18/18  Educator  [facility]  Instruction Review Code  1- Verbalizes Understanding      Cardiac Procedures: - Group verbal and written instruction to review commonly prescribed medications for heart disease. Reviews the medication, class of the drug, and side effects. Includes the steps to properly store meds and maintain the prescription regimen. (beta blockers and nitrates)   Cardiac Medications I: - Group verbal and written instruction to review commonly prescribed medications for heart disease. Reviews the medication,  class of the drug, and side effects. Includes the steps to properly store meds and maintain the prescription regimen.   Cardiac Rehab from 04/27/2018 in [facility] Cardiac and Pulmonary Rehab  Date  04/25/18  Educator  SB  Instruction Review Code  1- Verbalizes Understanding      Cardiac Medications II: -Group verbal and written instruction to review commonly prescribed medications for heart disease. Reviews the medication, class of the drug, and side effects. (all other drug classes)   Cardiac Rehab from 04/27/2018 in [facility] Cardiac and Pulmonary Rehab  Date  04/13/18  Educator  [facility]  Instruction Review Code  1- Verbalizes Understanding  Go Sex-Intimacy & Heart Disease, Get SMART - Goal Setting: - Group verbal and written instruction through game format to discuss heart disease and the return to sexual intimacy. Provides group verbal and written material to discuss and apply goal setting through the application of the S.M.A.R.T. Method.   Other Matters of the Heart: - Provides group verbal, written materials and models to describe Stable Angina and Peripheral Artery. Includes description of the disease process and treatment options available to the cardiac patient.   Cardiac Rehab from 04/27/2018 in [facility] Cardiac and Pulmonary Rehab  Date  04/18/18  Educator  SB  Instruction Review Code  1- Verbalizes Understanding      Exercise & Equipment Safety: - Individual verbal instruction and demonstration of equipment use and safety with use of the equipment.   Cardiac Rehab from 04/27/2018 in [facility] Cardiac and Pulmonary Rehab  Date  03/31/18  Educator  [facility]  Instruction Review Code  1- Verbalizes Understanding      Infection Prevention: - Provides verbal and written material to individual with discussion of infection control including proper hand washing and proper equipment cleaning during exercise session.   Cardiac Rehab from 04/27/2018 in [facility] Cardiac and Pulmonary Rehab  Date  03/31/18   Educator  [facility]  Instruction Review Code  1- Verbalizes Understanding      Falls Prevention: - Provides verbal and written material to individual with discussion of falls prevention and safety.   Cardiac Rehab from 04/27/2018 in [facility] Cardiac and Pulmonary Rehab  Date  03/31/18  Educator  [facility]  Instruction Review Code  1- Verbalizes Understanding      Diabetes: - Individual verbal and written instruction to review signs/symptoms of diabetes, desired ranges of glucose level fasting, after meals and with exercise. Acknowledge that pre and post exercise glucose checks will be done for 3 sessions at entry of program.   Know Your Numbers and Risk Factors: -Group verbal and written instruction about important numbers in your health.  Discussion of what are risk factors and how they play a role in the disease process.  Review of Cholesterol, Blood Pressure, Diabetes, and BMI and the role they play in your overall health.   Cardiac Rehab from 04/27/2018 in [facility] Cardiac and Pulmonary Rehab  Date  04/13/18  Educator  [facility]  Instruction Review Code  1- Verbalizes Understanding      Sleep Hygiene: -Provides group verbal and written instruction about how sleep can affect your health.  Define sleep hygiene, discuss sleep cycles and impact of sleep habits. Review good sleep hygiene tips.    Other: -Provides group and verbal instruction on various topics (see comments)   Knowledge Questionnaire Score: Knowledge Questionnaire Score - 03/31/18 1438      Knowledge Questionnaire Score   Pre Score  23/28 correct answers reviewed with patient who verbalized understanding       Core Components/Risk Factors/Patient Goals at Admission: Personal Goals and Risk Factors at Admission - 03/31/18 1428      Core Components/Risk Factors/Patient Goals on Admission    Weight Management  Yes;Weight Loss    Intervention  Weight Management: Develop a combined nutrition and exercise program designed to reach desired  caloric intake, while maintaining appropriate intake of nutrient and fiber, sodium and fats, and appropriate energy expenditure required for the weight goal.;Weight Management: Provide education and appropriate resources to help participant work on and attain dietary goals.;Weight Management/Obesity: Establish reasonable short term and long term weight goals.    Admit Weight  211 lb (95.7 kg)    Goal Weight: Short Term  205 lb (93 kg)    Goal Weight: Long Term  199 lb (90.3 kg)    Expected Outcomes  Short Term: Continue to assess and modify interventions until short term weight is achieved;Long Term: Adherence to nutrition and physical activity/exercise program aimed toward attainment of established weight goal;Weight Maintenance: Understanding of the daily nutrition guidelines, which includes 25-35% calories from fat, 7% or less cal from saturated fats, less than 242m cholesterol, less than 1.5gm of sodium, & 5 or more servings of fruits and vegetables daily;Weight Loss: Understanding of general recommendations for a balanced deficit meal plan, which promotes 1-2 lb weight loss per week and includes a negative energy balance of [phone] kcal/d;Understanding of distribution of calorie intake throughout the day with the consumption of 4-5 meals/snacks;Understanding recommendations for meals to include 15-35% energy as protein, 25-35% energy from fat, 35-60% energy from carbohydrates, less than 2087mof dietary cholesterol, 20-35 gm of total fiber daily    Hypertension  Yes    Intervention  Provide education on lifestyle modifcations including regular physical activity/exercise, weight management, moderate sodium restriction and increased consumption of fresh fruit, vegetables, and low fat dairy, alcohol moderation, and smoking cessation.;Monitor prescription use compliance.    Expected Outcomes  Short Term: Continued assessment and intervention until BP is < 140/9090mG in hypertensive participants. <  130/72m58m in hypertensive participants with diabetes, heart failure or chronic kidney disease.;Long Term: Maintenance of blood pressure at goal levels.    Lipids  Yes elevated triglycerides    Intervention  Provide education and support for participant on nutrition & aerobic/resistive exercise along with prescribed medications to achieve LDL <70mg66mL >40mg.91mExpected Outcomes  Short Term: Participant states understanding of desired cholesterol values and is compliant with medications prescribed. Participant is following exercise prescription and nutrition guidelines.;Long Term: Cholesterol controlled with medications as prescribed, with individualized exercise RX and with personalized nutrition plan. Value goals: LDL < 70mg, 84m> 40 mg.    Stress  Yes    Intervention  Offer individual and/or small group education and counseling on adjustment to heart disease, stress management and health-related lifestyle change. Teach and support self-help strategies.;Refer participants experiencing significant psychosocial distress to appropriate mental health specialists for further evaluation and treatment. When possible, include family members and significant others in education/counseling sessions.    Expected Outcomes  Short Term: Participant demonstrates changes in health-related behavior, relaxation and other stress management skills, ability to obtain effective social support, and compliance with psychotropic medications if prescribed.;Long Term: Emotional wellbeing is indicated by absence of clinically significant psychosocial distress or social isolation.    Personal Goal Other  Yes    Personal Goal  Return to playing pickle ball and bicycling.    Intervention  Will attent class work to improve stamina and endurance.     Expected Outcomes  He will be able to have enough stamina and endurance to return to playing pickle ball and go longer distances on his bike.        Core Components/Risk  Factors/Patient Goals Review:  Goals and Risk Factor Review    Row Name 04/22/18 1041             Core Components/Risk Factors/Patient Goals Review   Personal Goals Review  Weight Management/Obesity;Hypertension;Lipids       Review  Dace Adilsonly lost about 1 or 2 lbs since starting the program.  He would like to  lose more, but is just now starting to get back his strength and stamina which will help him exercise more to help lose more weight.  He has been eating better as well.  His blood pressures have been doing well here in class.  He has not been checking it at home, but he does have a cuff and will start checking it more frequently at home.  He says that he feels his new medicaiton for his blood pressure seems to be working very well for him.  He is also trying to work with his IBS and so far has been able to remain in control of the symptoms.        Expected Outcomes  Short: Starting using cuff at home to monitor blood pressure.  Long: Continue to work on weight loss.           Core Components/Risk Factors/Patient Goals at Discharge (Final Review):  Goals and Risk Factor Review - 04/22/18 1041      Core Components/Risk Factors/Patient Goals Review   Personal Goals Review  Weight Management/Obesity;Hypertension;Lipids    Review  Ondre has only lost about 1 or 2 lbs since starting the program.  He would like to lose more, but is just now starting to get back his strength and stamina which will help him exercise more to help lose more weight.  He has been eating better as well.  His blood pressures have been doing well here in class.  He has not been checking it at home, but he does have a cuff and will start checking it more frequently at home.  He says that he feels his new medicaiton for his blood pressure seems to be working very well for him.  He is also trying to work with his IBS and so far has been able to remain in control of the symptoms.     Expected Outcomes  Short: Starting  using cuff at home to monitor blood pressure.  Long: Continue to work on weight loss.        ITP Comments: ITP Comments    Row Name 03/31/18 1424 04/04/18 0944 04/13/18 0611 04/22/18 1037 05/11/18 0545   ITP Comments  Medical Review Completed; initial ITP created. Diagnosis Documentation can be found in [place] encounter dated 03/23/2018.  Athol reported jaw pain on the elliptical today.  It is the same jaw pain that he was having prior to his stents. He has continued to have jaw pain since his stents, thus is on Imdur.  Today it got to a 3/10.  We talked about making sure he lets the staff know about it and not to exceed a 6/10 before taking a rest break.   30 day review. Continue with ITP unless directed changes per Medical Director  Kwabena will be leaving next week to go on a trip to [place] for three weeks.  He will be out from 5/9-5/31 and will return in June.   30 day review. Continue with ITP unless directed changes per Medical Director      Comments:

## 2018-05-23 ENCOUNTER — Encounter: Payer: Medicare Other | Attending: Cardiology

## 2018-05-23 DIAGNOSIS — Z79899 Other long term (current) drug therapy: Secondary | ICD-10-CM | POA: Insufficient documentation

## 2018-05-23 DIAGNOSIS — E785 Hyperlipidemia, unspecified: Secondary | ICD-10-CM | POA: Insufficient documentation

## 2018-05-23 DIAGNOSIS — Z7982 Long term (current) use of aspirin: Secondary | ICD-10-CM | POA: Insufficient documentation

## 2018-05-23 DIAGNOSIS — I10 Essential (primary) hypertension: Secondary | ICD-10-CM | POA: Insufficient documentation

## 2018-05-25 DIAGNOSIS — I10 Essential (primary) hypertension: Secondary | ICD-10-CM | POA: Diagnosis not present

## 2018-05-25 DIAGNOSIS — Z79899 Other long term (current) drug therapy: Secondary | ICD-10-CM | POA: Diagnosis not present

## 2018-05-25 DIAGNOSIS — Z955 Presence of coronary angioplasty implant and graft: Secondary | ICD-10-CM | POA: Diagnosis present

## 2018-05-25 DIAGNOSIS — Z7982 Long term (current) use of aspirin: Secondary | ICD-10-CM | POA: Diagnosis not present

## 2018-05-25 DIAGNOSIS — E785 Hyperlipidemia, unspecified: Secondary | ICD-10-CM | POA: Diagnosis not present

## 2018-05-25 NOTE — Progress Notes (Signed)
Daily Session Note  Patient Details  Name: Richard Melton MRN: [id number] Date of Birth: 01/26/1941 Referring Provider:     Cardiac Rehab from 03/31/2018 in [facility] Cardiac and Pulmonary Rehab  Referring Provider  [name]      Encounter Date: 05/25/2018  Check In: Session Check In - 05/25/18 0751      Check-In   Location  [facility]-Cardiac & Pulmonary Rehab    Staff Present  Heath Lark, RN, BSN, CCRP;Joseph Christain [place], RN BSN    Supervising physician immediately available to respond to emergencies  See telemetry face sheet for immediately available ER MD    Medication changes reported      No    Fall or balance concerns reported     No    Tobacco Cessation  No Change    Warm-up and Cool-down  Performed on first and last piece of equipment    Resistance Training Performed  Yes    VAD Patient?  No      Pain Assessment   Currently in Pain?  No/denies          Social History   Tobacco Use  Smoking Status Never Smoker  Smokeless Tobacco Never Used    Goals Met:  Independence with exercise equipment Exercise tolerated well No report of cardiac concerns or symptoms Strength training completed today  Goals Unmet:  Not Applicable  Comments: Pt able to follow exercise prescription today without complaint.  Will continue to monitor for progression.   Dr. Emily Filbert is Medical Director for [facility].

## 2018-05-27 DIAGNOSIS — Z955 Presence of coronary angioplasty implant and graft: Secondary | ICD-10-CM

## 2018-05-27 DIAGNOSIS — Z7982 Long term (current) use of aspirin: Secondary | ICD-10-CM | POA: Diagnosis not present

## 2018-05-27 NOTE — Progress Notes (Signed)
Daily Session Note  Patient Details  Name: Richard Melton MRN: [id number] Date of Birth: 1941-04-11 Referring Provider:     Cardiac Rehab from 03/31/2018 in [facility] Cardiac and Pulmonary Rehab  Referring Provider  [name]      Encounter Date: 05/27/2018  Check In: Session Check In - 05/27/18 0844      Check-In   Location  [facility]-Cardiac & Pulmonary Rehab    Staff Present  Constance Goltz, RN BSN;Mandi Ballard, BS, [place];Nada Maclachlan, BA, ACSM CEP, Exercise Physiologist    Supervising physician immediately available to respond to emergencies  See telemetry face sheet for immediately available ER MD    Medication changes reported      No    Fall or balance concerns reported     No    Warm-up and Cool-down  Performed on first and last piece of equipment    Resistance Training Performed  Yes    VAD Patient?  No      Pain Assessment   Currently in Pain?  No/denies    Multiple Pain Sites  No          Social History   Tobacco Use  Smoking Status Never Smoker  Smokeless Tobacco Never Used    Goals Met:  Independence with exercise equipment Exercise tolerated well No report of cardiac concerns or symptoms Strength training completed today  Goals Unmet:  Not Applicable  Comments: Pt able to follow exercise prescription today without complaint.  Will continue to monitor for progression.    Dr. Emily Filbert is Medical Director for [facility].

## 2018-06-03 ENCOUNTER — Encounter: Payer: Medicare Other | Admitting: *Deleted

## 2018-06-03 DIAGNOSIS — Z955 Presence of coronary angioplasty implant and graft: Secondary | ICD-10-CM

## 2018-06-03 DIAGNOSIS — Z7982 Long term (current) use of aspirin: Secondary | ICD-10-CM | POA: Diagnosis not present

## 2018-06-03 NOTE — Progress Notes (Signed)
Daily Session Note  Patient Details  Name: Richard Melton MRN: [id number] Date of Birth: [birth year] Referring Provider:     Cardiac Rehab from 03/31/2018 in [facility] Cardiac and Pulmonary Rehab  Referring Provider  [name]      Encounter Date: 06/03/2018  Check In: Session Check In - 06/03/18 0816      Check-In   Location  [facility]-Cardiac & Pulmonary Rehab    Staff Present  Renita Papa, RN BSN;Fontaine Hehl Luan Pulling, MA, RCEP, CCRP, Exercise Physiologist;Amanda Oletta Darter, [place], ACSM CEP, Exercise Physiologist    Supervising physician immediately available to respond to emergencies  See telemetry face sheet for immediately available ER MD    Medication changes reported      No    Fall or balance concerns reported     No    Warm-up and Cool-down  Performed on first and last piece of equipment    Resistance Training Performed  Yes    VAD Patient?  No      Pain Assessment   Currently in Pain?  No/denies          Social History   Tobacco Use  Smoking Status Never Smoker  Smokeless Tobacco Never Used    Goals Met:  Independence with exercise equipment Exercise tolerated well Personal goals reviewed No report of cardiac concerns or symptoms Strength training completed today  Goals Unmet:  Not Applicable  Comments: Pt able to follow exercise prescription today without complaint.  Will continue to monitor for progression. See ITP for goal review   Dr. Emily Filbert is Medical Director for [place] and [facility].

## 2018-06-06 ENCOUNTER — Encounter: Payer: Medicare Other | Admitting: *Deleted

## 2018-06-06 DIAGNOSIS — Z955 Presence of coronary angioplasty implant and graft: Secondary | ICD-10-CM

## 2018-06-06 DIAGNOSIS — Z7982 Long term (current) use of aspirin: Secondary | ICD-10-CM | POA: Diagnosis not present

## 2018-06-06 NOTE — Progress Notes (Signed)
Daily Session Note  Patient Details  Name: Richard Melton MRN: [id number] Date of Birth: 1941-01-08 Referring Provider:     Cardiac Rehab from 03/31/2018 in [facility] Cardiac and Pulmonary Rehab  Referring Provider  [name]      Encounter Date: 06/06/2018  Check In: Session Check In - 06/06/18 0805      Check-In   Location  [facility]-Cardiac & Pulmonary Rehab    Staff Present  Earlean Shawl, BS, ACSM CEP, Exercise Physiologist;[name], RN, BSN, CCRP;Gayathri Futrell Luan Pulling, MA, RCEP, CCRP, Exercise Physiologist    Supervising physician immediately available to respond to emergencies  See telemetry face sheet for immediately available ER MD    Medication changes reported      No    Fall or balance concerns reported     No    Warm-up and Cool-down  Performed on first and last piece of equipment    Resistance Training Performed  Yes    VAD Patient?  No      Pain Assessment   Currently in Pain?  No/denies          Social History   Tobacco Use  Smoking Status Never Smoker  Smokeless Tobacco Never Used    Goals Met:  Independence with exercise equipment Exercise tolerated well No report of cardiac concerns or symptoms Strength training completed today  Goals Unmet:  Not Applicable  Comments: Pt able to follow exercise prescription today without complaint.  Will continue to monitor for progression.    Dr. Emily Filbert is Medical Director for [facility] and [facility].

## 2018-06-08 ENCOUNTER — Encounter: Payer: Self-pay | Admitting: *Deleted

## 2018-06-08 DIAGNOSIS — Z7982 Long term (current) use of aspirin: Secondary | ICD-10-CM | POA: Diagnosis not present

## 2018-06-08 DIAGNOSIS — Z955 Presence of coronary angioplasty implant and graft: Secondary | ICD-10-CM

## 2018-06-08 NOTE — Progress Notes (Signed)
Daily Session Note  Patient Details  Name: Richard Melton MRN: [id number] Date of Birth: 24-Jan-1941 Referring Provider:     Cardiac Rehab from 03/31/2018 in [facility] Cardiac and Pulmonary Rehab  Referring Provider  [name]      Encounter Date: 06/08/2018  Check In: Session Check In - 06/08/18 0752      Check-In   Location  [facility]-Cardiac & Pulmonary Rehab    Staff Present  Justin Mend RCP,RRT,BSRT;Heath Lark, RN, BSN, CCRP;Jessica Luan Pulling, MA, RCEP, CCRP, Exercise Physiologist    Supervising physician immediately available to respond to emergencies  See telemetry face sheet for immediately available ER MD    Medication changes reported      No    Fall or balance concerns reported     No    Tobacco Cessation  No Change    Warm-up and Cool-down  Performed on first and last piece of equipment    Resistance Training Performed  Yes    VAD Patient?  No      Pain Assessment   Currently in Pain?  No/denies          Social History   Tobacco Use  Smoking Status Never Smoker  Smokeless Tobacco Never Used    Goals Met:  Independence with exercise equipment Exercise tolerated well No report of cardiac concerns or symptoms Strength training completed today  Goals Unmet:  Not Applicable  Comments: Pt able to follow exercise prescription today without complaint.  Will continue to monitor for progression.   Dr. Emily Filbert is Medical Director for [place] and [facility].

## 2018-06-08 NOTE — Progress Notes (Signed)
Cardiac Individual Treatment Plan  Patient Details  Name: Richard Melton MRN: [id number] Date of Birth: 1941-12-13 Referring Provider:     Cardiac Rehab from 03/31/2018 in [facility]  Referring Provider  [name]      Initial Encounter Date:    Cardiac Rehab from 03/31/2018 in [facility] Cardiac and Pulmonary Rehab  Date  03/31/18  Referring Provider  [name]      Visit Diagnosis: Status post coronary artery stent placement  Patient's Home Medications on Admission:  Current Outpatient Medications:  .  acetaminophen (TYLENOL) 500 MG tablet, Take 500 mg by mouth every 6 (six) hours as needed for moderate pain or headache., Disp: , Rfl:  .  amLODipine (NORVASC) 2.5 MG tablet, Take 2.5 mg by mouth every evening. , Disp: , Rfl:  .  aspirin EC 81 MG tablet, Take 81 mg by mouth daily., Disp: , Rfl:  .  clopidogrel (PLAVIX) 75 MG tablet, Take 1 tablet (75 mg total) by mouth daily., Disp: 30 tablet, Rfl: 11 .  esomeprazole (NEXIUM) 40 MG capsule, Take 40 mg by mouth daily. , Disp: , Rfl:  .  fexofenadine (ALLEGRA) 180 MG tablet, Take 180 mg by mouth every evening. , Disp: , Rfl:  .  hyoscyamine (LEVSIN, ANASPAZ) 0.125 MG tablet, Take 0.125 mg by mouth every 4 (four) hours as needed for cramping. , Disp: , Rfl:  .  isosorbide mononitrate (IMDUR) 30 MG 24 hr tablet, , Disp: , Rfl: 11 .  Melatonin 3 MG TABS, Take 3 mg by mouth at bedtime. , Disp: , Rfl:  .  mirtazapine (REMERON) 15 MG tablet, Take 7.5 mg by mouth at bedtime., Disp: , Rfl:  .  mometasone (ELOCON) 0.1 % lotion, Apply 4 drops topically daily as needed (for ear itch)., Disp: , Rfl:  .  Multiple Vitamins-Minerals (CENTRUM SILVER PO), Take 1 tablet by mouth daily., Disp: , Rfl:  .  olmesartan (BENICAR) 40 MG tablet, Take 40 mg by mouth daily., Disp: , Rfl:  .  Omega-3 Fatty Acids (OMEGA 3 PO), Take 1 capsule by mouth 2 (two) times daily. , Disp: , Rfl:  .  Probiotic Product (ALIGN) 4 MG CAPS, Take 8 mg by mouth  daily. , Disp: , Rfl:  .  rosuvastatin (CRESTOR) 10 MG tablet, Take 10 mg by mouth daily., Disp: , Rfl:  .  sodium chloride (OCEAN) 0.65 % SOLN nasal spray, Place 1-2 sprays into both nostrils as needed for congestion., Disp: , Rfl:   Past Medical History: Past Medical History:  Diagnosis Date  . Allergic   . Anxiety   . Cancer [place])    Prostate  . Chronic insomnia   . Headache    Migraines  . Hyperlipidemia   . Hypertension   . Hypertriglyceridemia   . IBS (irritable bowel syndrome)   . Psoriasis   . Sleep apnea     Tobacco Use: Social History   Tobacco Use  Smoking Status Never Smoker  Smokeless Tobacco Never Used    Labs: Recent Review Flowsheet Data    There is no flowsheet data to display.       Exercise Target Goals:    Exercise Program Goal: Individual exercise prescription set using results from initial 6 min walk test and THRR while considering  patient's activity barriers and safety.   Exercise Prescription Goal: Initial exercise prescription builds to 30-45 minutes a day of aerobic activity, 2-3 days per week.  Home exercise guidelines will be given to  patient during program as part of exercise prescription that the participant will acknowledge.  Activity Barriers & Risk Stratification: Activity Barriers & Cardiac Risk Stratification - 03/31/18 1435      Activity Barriers & Cardiac Risk Stratification   Activity Barriers  Back Problems;Joint Problems    Cardiac Risk Stratification  High       6 Minute Walk: 6 Minute Walk    Row Name 03/31/18 1446         6 Minute Walk   Distance  1542 feet     Walk Time  6 minutes     # of Rest Breaks  0     MPH  2.92     METS  3.18     RPE  11     Perceived Dyspnea   0     VO2 Peak  11.12     Symptoms  Yes (comment)     Comments  jaw pain2/10 that resolved with rest - has spoken with Dr - not a new s/s     Resting HR  60 bpm     Resting BP  138/60     Resting Oxygen Saturation   98 %     Exercise  Oxygen Saturation  during 6 min walk  99 %     Max Ex. HR  96 bpm     Max Ex. BP  142/66     2 Minute Post BP  118/56        Oxygen Initial Assessment: Oxygen Initial Assessment - 03/31/18 1440      Home Oxygen   Sleep Oxygen Prescription  CPAP    Liters per minute  2       Oxygen Re-Evaluation:   Oxygen Discharge (Final Oxygen Re-Evaluation):   Initial Exercise Prescription: Initial Exercise Prescription - 03/31/18 1400      Date of Initial Exercise RX and Referring Provider   Date  03/31/18    Referring Provider  [name]      Treadmill   MPH  2.8    Grade  0    Minutes  15    METs  3.14      Arm Ergometer   Level  2    Watts  50    RPM  40    Minutes  15    METs  3      Recumbant Elliptical   Level  3    RPM  50    Minutes  15    METs  3      Prescription Details   Frequency (times per week)  3    Duration  Progress to 45 minutes of aerobic exercise without signs/symptoms of physical distress      Intensity   THRR 40-80% of Max Heartrate  94-128    Ratings of Perceived Exertion  11-15    Perceived Dyspnea  0-4      Resistance Training   Training Prescription  Yes    Weight  4 lb    Reps  10-15       Perform Capillary Blood Glucose checks as needed.  Exercise Prescription Changes: Exercise Prescription Changes    Row Name 03/31/18 1400 04/06/18 1500 04/08/18 0800 04/20/18 1400 05/02/18 1500     Response to Exercise   Blood Pressure (Admit)  138/60  142/78  -  134/74  122/74   Blood Pressure (Exercise)  142/66  142/68  -  144/70  138/70   Blood Pressure (  Exit)  118/56  124/74  -  114/60  122/74   Heart Rate (Admit)  69 bpm  59 bpm  -  70 bpm  55 bpm   Heart Rate (Exercise)  96 bpm  112 bpm  -  129 bpm  99 bpm   Heart Rate (Exit)  64 bpm  66 bpm  -  68 bpm  61 bpm   Oxygen Saturation (Admit)  99 %  -  -  -  -   Oxygen Saturation (Exercise)  98 %  -  -  -  -   Rating of Perceived Exertion (Exercise)  11  13  -  13  12   Symptoms  -  none   -  none  none   Comments  -  first full day of exercise  -  -  -   Duration  -  Progress to 45 minutes of aerobic exercise without signs/symptoms of physical distress  -  Continue with 45 min of aerobic exercise without signs/symptoms of physical distress.  Continue with 45 min of aerobic exercise without signs/symptoms of physical distress.   Intensity  -  THRR unchanged  -  THRR unchanged  THRR unchanged     Progression   Progression  -  Continue to progress workloads to maintain intensity without signs/symptoms of physical distress.  -  Continue to progress workloads to maintain intensity without signs/symptoms of physical distress.  Continue to progress workloads to maintain intensity without signs/symptoms of physical distress.   Average METs  -  2.77  -  3.02  3.12     Resistance Training   Training Prescription  -  Yes  -  Yes  Yes   Weight  -  4 lbs  -  4 lbs  4 lbs   Reps  -  10-15  -  10-15  10-15     Interval Training   Interval Training  -  No  -  No  No     Treadmill   MPH  -  2.8  -  2.8  2.8   Grade  -  0  -  1  1   Minutes  -  15  -  15  15   METs  -  3.14  -  3.53  3.53     Arm Ergometer   Level  -  1  -  1  1   Minutes  -  15  -  15  15   METs  -  2.4  -  2.5  2.7     Elliptical   Level  -  -  -  1  1   Speed  -  -  -  3.2  3.2   Minutes  -  -  -  15  15     Home Exercise Plan   Plans to continue exercise at  -  -  [employer] (comment) [employer] and walking  [employer] (comment) [place] and walking  [employer] (comment) [employer] and walking   Frequency  -  -  Add 2 additional days to program exercise sessions.  Add 2 additional days to program exercise sessions.  Add 2 additional days to program exercise sessions.   Initial Home Exercises Provided  -  -  04/08/18  04/08/18  04/08/18   Row Name 05/31/18 1600  Response to Exercise   Blood Pressure (Admit)  126/68       Blood Pressure (Exercise)  144/72        Blood Pressure (Exit)  116/56       Heart Rate (Admit)  58 bpm       Heart Rate (Exercise)  100 bpm       Heart Rate (Exit)  40 bpm       Rating of Perceived Exertion (Exercise)  12       Symptoms  none       Duration  Continue with 45 min of aerobic exercise without signs/symptoms of physical distress.       Intensity  THRR unchanged         Progression   Progression  Continue to progress workloads to maintain intensity without signs/symptoms of physical distress.       Average METs  3.65         Resistance Training   Training Prescription  Yes       Weight  4 lbs       Reps  10-15         Interval Training   Interval Training  No         Treadmill   MPH  2.8       Grade  1       Minutes  15       METs  3.53         Arm Ergometer   Level  1       Watts  50       Minutes  15       METs  3.76         Elliptical   Level  1       Speed  3.2       Minutes  15         Home Exercise Plan   Plans to continue exercise at  [employer] (comment) [place] and walking       Frequency  Add 2 additional days to program exercise sessions.       Initial Home Exercises Provided  04/08/18          Exercise Comments: Exercise Comments    Row Name 04/04/18 0800 04/04/18 0945         Exercise Comments  :First full day of exercise!  Patient was oriented to gym and equipment including functions, settings, policies, and procedures.  Patient's individual exercise prescription and treatment plan were reviewed.  All starting workloads were established based on the results of the 6 minute walk test done at initial orientation visit.  The plan for exercise progression was also introduced and progression will be customized based on patient's performance and goals.  Kramer reported jaw pain on the elliptical today.  It is the same jaw pain that he was having prior to his stents. He has continued to have jaw pain since his stents, thus is on Imdur.  Today it got to a 3/10.  We  talked about making sure he lets the staff know about it and not to exceed a 6/10 before taking a rest break.          Exercise Goals and Review: Exercise Goals    Row Name 03/31/18 1445             Exercise Goals   Increase Physical Activity  Yes       Intervention  Provide advice, education, support and counseling about physical activity/exercise needs.;Develop an individualized exercise prescription for aerobic and resistive training based on initial evaluation findings, risk stratification, comorbidities and participant's personal goals.       Expected Outcomes  Short Term: Attend rehab on a regular basis to increase amount of physical activity.;Long Term: Add in home exercise to make exercise part of routine and to increase amount of physical activity.;Long Term: Exercising regularly at least 3-5 days a week.       Increase Strength and Stamina  Yes       Intervention  Provide advice, education, support and counseling about physical activity/exercise needs.;Develop an individualized exercise prescription for aerobic and resistive training based on initial evaluation findings, risk stratification, comorbidities and participant's personal goals.       Expected Outcomes  Short Term: Increase workloads from initial exercise prescription for resistance, speed, and METs.;Short Term: Perform resistance training exercises routinely during rehab and add in resistance training at home;Long Term: Improve cardiorespiratory fitness, muscular endurance and strength as measured by increased METs and functional capacity (6MWT)       Able to understand and use rate of perceived exertion (RPE) scale  Yes       Intervention  Provide education and explanation on how to use RPE scale       Expected Outcomes  Short Term: Able to use RPE daily in rehab to express subjective intensity level;Long Term:  Able to use RPE to guide intensity level when exercising independently       Able to understand and use Dyspnea  scale  Yes       Intervention  Provide education and explanation on how to use Dyspnea scale       Expected Outcomes  Short Term: Able to use Dyspnea scale daily in rehab to express subjective sense of shortness of breath during exertion;Long Term: Able to use Dyspnea scale to guide intensity level when exercising independently       Knowledge and understanding of Target Heart Rate Range (THRR)  Yes       Intervention  Provide education and explanation of THRR including how the numbers were predicted and where they are located for reference       Expected Outcomes  Short Term: Able to state/look up THRR;Short Term: Able to use daily as guideline for intensity in rehab;Long Term: Able to use THRR to govern intensity when exercising independently       Able to check pulse independently  Yes       Intervention  Provide education and demonstration on how to check pulse in carotid and radial arteries.;Review the importance of being able to check your own pulse for safety during independent exercise       Expected Outcomes  Short Term: Able to explain why pulse checking is important during independent exercise;Long Term: Able to check pulse independently and accurately       Understanding of Exercise Prescription  Yes       Intervention  Provide education, explanation, and written materials on patient's individual exercise prescription       Expected Outcomes  Short Term: Able to explain program exercise prescription;Long Term: Able to explain home exercise prescription to exercise independently          Exercise Goals Re-Evaluation : Exercise Goals Re-Evaluation    Row Name 04/04/18 0801 04/08/18 0857 04/20/18 1450 04/22/18 1038 05/02/18 1510     Exercise Goal Re-Evaluation   Exercise Goals Review  Understanding  of Exercise Prescription;Knowledge and understanding of Target Heart Rate Range (THRR);Able to understand and use rate of perceived exertion (RPE) scale;Increase Strength and Stamina;Increase  Physical Activity  Increase Physical Activity;Able to understand and use rate of perceived exertion (RPE) scale;Knowledge and understanding of Target Heart Rate Range (THRR);Understanding of Exercise Prescription;Able to check pulse independently;Increase Strength and Stamina  Increase Physical Activity;Understanding of Exercise Prescription;Increase Strength and Stamina  Increase Physical Activity;Understanding of Exercise Prescription;Increase Strength and Stamina  Increase Physical Activity;Understanding of Exercise Prescription;Increase Strength and Stamina   Comments  Reviewed RPE scale, THR and program prescription with pt today.  Pt voiced understanding and was given a copy of goals to take home.   Reviewed home exercise with pt today.  Pt plans to walk and go to gym at [facility] for exercise.  Reviewed THR, pulse, RPE, sign and symptoms, and when to call 911 or MD.  Also discussed weather considerations and indoor options.  Pt voiced understanding.  Lander has been doing well in rehab.  He has taken to the elliptical and is now getting the full 15 min on there.  We will start to increase his workloads more.  We will continue to monitor his progression.   Rusty continues to do well in rehab.  He feels that it is going good so far.  He has been walking at home for 30 min 2-3 days a week.  He has also been able to get back into his pickleball games on Tuesdays and Thursdays.  He can tell that his stamina is improving as he is no longer getting as winded, still get tired but able to play for two hours versus just one hour.  He is please with his progress so far.  We will continue to work with him on building his strength and stamina.   Adil has been doing well in rehab.  He will be out the rest of the month with his trip to [place].  He plans to do some walking while he is there and will return to finish the program in June.     Expected Outcomes  Short: Use RPE daily to regulate intensity.  Long: Follow  program prescription in THR.  Short: Start to add in at least two extra days a week at the gym starting in two weeks.  Long: Continue to exercise indep  Short: Increase workload on arm crank and treadmill.  Long: Continue to add in exercise at gym on off days.   Short: Increase workload on arm crank and treadmill.  Long: Continue to add in exercise at gym and walking at home on off days.   Short: Enjoy trip.  Long: Return to rehab to finish.    Humphrey Name 05/31/18 1604 06/03/18 0803           Exercise Goal Re-Evaluation   Exercise Goals Review  Increase Physical Activity;Understanding of Exercise Prescription;Increase Strength and Stamina  Increase Physical Activity;Understanding of Exercise Prescription;Increase Strength and Stamina      Comments  Mikias has returned from his trip to [place] and was able to pick up from where he left off. He was getting 50 watts on the arm crank.  We will continue to monitor his progression.   Lauris is doing good in exercise.  He had some jaw tightness on treadmill today, but it relieved once he got off.  He has been walking on his off days.  He will aim to get at least two days a week for 50mn.  Expected Outcomes  Short: Continue to attend rehab regularly again.  Long: Continue to exercise on his off days.   Short: Continue to attend rehab regularly again.  Long: Continue to exercise on his off days.          Discharge Exercise Prescription (Final Exercise Prescription Changes): Exercise Prescription Changes - 05/31/18 1600      Response to Exercise   Blood Pressure (Admit)  126/68    Blood Pressure (Exercise)  144/72    Blood Pressure (Exit)  116/56    Heart Rate (Admit)  58 bpm    Heart Rate (Exercise)  100 bpm    Heart Rate (Exit)  40 bpm    Rating of Perceived Exertion (Exercise)  12    Symptoms  none    Duration  Continue with 45 min of aerobic exercise without signs/symptoms of physical distress.    Intensity  THRR unchanged      Progression    Progression  Continue to progress workloads to maintain intensity without signs/symptoms of physical distress.    Average METs  3.65      Resistance Training   Training Prescription  Yes    Weight  4 lbs    Reps  10-15      Interval Training   Interval Training  No      Treadmill   MPH  2.8    Grade  1    Minutes  15    METs  3.53      Arm Ergometer   Level  1    Watts  50    Minutes  15    METs  3.76      Elliptical   Level  1    Speed  3.2    Minutes  15      Home Exercise Plan   Plans to continue exercise at  [employer] (comment) [place] and walking    Frequency  Add 2 additional days to program exercise sessions.    Initial Home Exercises Provided  04/08/18       Nutrition:  Target Goals: Understanding of nutrition guidelines, daily intake of sodium <1551m, cholesterol <2068m calories 30% from fat and 7% or less from saturated fats, daily to have 5 or more servings of fruits and vegetables.  Biometrics: Pre Biometrics - 03/31/18 1444      Pre Biometrics   Height  6' 1.4" (1.864 m)    Weight  211 lb 1.6 oz (95.8 kg)    Waist Circumference  41.5 inches    Hip Circumference  40 inches    Waist to Hip Ratio  1.04 %    BMI (Calculated)  27.56        Nutrition Therapy Plan and Nutrition Goals: Nutrition Therapy & Goals - 04/18/18 1006      Nutrition Therapy   Diet  TLC/ Heart Healthy    Protein (specify units)  9oz    Fiber  35 grams    Whole Grain Foods  3 servings    Saturated Fats  16 max. grams    Fruits and Vegetables  6 servings/day 8 ideal    Sodium  2000 grams      Personal Nutrition Goals   Nutrition Goal  Continue to practice choosing lower-sodium food and beverage options. Try not to add salt to meals after cooking, choose salt-free seasonings instead    Personal Goal #2  Practice choosing foods from different food groups when puting together  a lunch meal, for example: a lean protein + 1 serving of heart healthy fat + a  starch (baked potato, rice, vegetble, fruit). include dairy when possible    Personal Goal #3  Choose foods high in fiber, including fruits, vegetables, whole grains, beans and skins of foods like potatoes    Comments  He and his wife have been working towards eating a heart-healthy diet. They typically do not cook with salt and look for heart-healthy options when eating out. He does still add salt to meals on occasion      Intervention Plan   Intervention  Prescribe, educate and counsel regarding individualized specific dietary modifications aiming towards targeted core components such as weight, hypertension, lipid management, diabetes, heart failure and other comorbidities.    Expected Outcomes  Short Term Goal: Understand basic principles of dietary content, such as calories, fat, sodium, cholesterol and nutrients.;Long Term Goal: Adherence to prescribed nutrition plan.;Short Term Goal: A plan has been developed with personal nutrition goals set during dietitian appointment.       Nutrition Assessments: Nutrition Assessments - 03/31/18 1430      MEDFICTS Scores   Pre Score  30       Nutrition Goals Re-Evaluation: Nutrition Goals Re-Evaluation    Row Name 04/18/18 1011 04/22/18 1045 06/03/18 0807         Goals   Nutrition Goal  Practice choosing foods from different food groups when puting together a lunch meal, for example: a lean protein + 1 serving of heart healthy fat + a starch (baked potato, rice, vegetble, fruit). include dairy when possible  Practice choosing foods from different food groups when puting together a lunch meal, for example: a lean protein + 1 serving of heart healthy fat + a starch (baked potato, rice, vegetble, fruit). include dairy when possible; Higher fiber, Lower sodium  Practice choosing foods from different food groups when puting together a lunch meal, for example: a lean protein + 1 serving of heart healthy fat + a starch (baked potato, rice, vegetble,  fruit). include dairy when possible; Higher fiber, Lower sodium     Comment  He is in the process of making his lunch meal larger than dinner to help manage his IBS but is struggling to find options and variety  Cordelle enjoyed meeting with Lattie Haw earlier this week.  He said that he learned a lot from her and will start to use her suggestions in finding more fiber and lower sodium options.   Daryan continues to do well with his diet.  He continues to lose weight.  He continues to focus on more fiber and lower sodium.       Expected Outcome  He will choose lunch options that are low in sodium and that contain foods from various food groups with a heart-healthy diet in mind  Short: He will choose lunch options that are low in sodium and that contain foods from various food groups with a heart-healthy diet in mind  Long: Continue to follow recommendations  Short: Continue to to aim for low sodium and high fiber.  Long: Continue to follow heart healthy diet.        Personal Goal #2 Re-Evaluation   Personal Goal #2  Choose foods high in fiber including fruits, vegetables, whole grains, beans and skins of foods like potatoes  -  -       Personal Goal #3 Re-Evaluation   Personal Goal #3  Continue to practice choosing lower-sodim food and beverage  options. Try not to add salt to meals after cooking, choose salt-free seasonings instead  -  -        Nutrition Goals Discharge (Final Nutrition Goals Re-Evaluation): Nutrition Goals Re-Evaluation - 06/03/18 0807      Goals   Nutrition Goal  Practice choosing foods from different food groups when puting together a lunch meal, for example: a lean protein + 1 serving of heart healthy fat + a starch (baked potato, rice, vegetble, fruit). include dairy when possible; Higher fiber, Lower sodium    Comment  Kaycen continues to do well with his diet.  He continues to lose weight.  He continues to focus on more fiber and lower sodium.      Expected Outcome  Short: Continue  to to aim for low sodium and high fiber.  Long: Continue to follow heart healthy diet.        Psychosocial: Target Goals: Acknowledge presence or absence of significant depression and/or stress, maximize coping skills, provide positive support system. Participant is able to verbalize types and ability to use techniques and skills needed for reducing stress and depression.   Initial Review & Psychosocial Screening: Initial Psych Review & Screening - 03/31/18 1431      Initial Review   Current issues with  Current Anxiety/Panic;Current Sleep Concerns;Current Stress Concerns    Source of Stress Concerns  Chronic Illness;Unable to participate in former interests or hobbies    Comments  He had prostate CA in 2008 and still stresses about a relapse of cancer of some type. States they have undergone some stress from a move 2 years ago from [place] area to [facility] [facility] and still are struggling to find their place here. He also struggles some socially and has stress since his event being able to be as active as he once was.       Family Dynamics   Good Support System?  Yes    Comments  wife and [place] community      Barriers   Psychosocial barriers to participate in program  The patient should benefit from training in stress management and relaxation.      Screening Interventions   Interventions  Encouraged to exercise;Program counselor consult    Expected Outcomes  Short Term goal: Utilizing psychosocial counselor, staff and physician to assist with identification of specific Stressors or current issues interfering with healing process. Setting desired goal for each stressor or current issue identified.;Long Term Goal: Stressors or current issues are controlled or eliminated.;Short Term goal: Identification and review with participant of any Quality of Life or Depression concerns found by scoring the questionnaire.;Long Term goal: The participant improves quality of Life and PHQ9 Scores as seen by post scores  and/or verbalization of changes       Quality of Life Scores:  Quality of Life - 03/31/18 1434      Quality of Life Scores   Health/Function Pre  19.92 %    Socioeconomic Pre  25 %    Psych/Spiritual Pre  20.43 %    Family Pre  24 %    GLOBAL Pre  21.71 %      Scores of 19 and below usually indicate a poorer quality of life in these areas.  A difference of  2-3 points is a clinically meaningful difference.  A difference of 2-3 points in the total score of the Quality of Life Index has been associated with significant improvement in overall quality of life, self-image, physical symptoms, and general health  in studies assessing change in quality of life.  PHQ-9: Recent Review Flowsheet Data    Depression screen [facility] 2/9 03/31/2018   Decreased Interest 0   Down, Depressed, Hopeless 0   PHQ - 2 Score 0   Altered sleeping 0   Tired, decreased energy 1   Change in appetite 2   Feeling bad or failure about yourself  1   Trouble concentrating 0   Moving slowly or fidgety/restless 0   Suicidal thoughts 0   PHQ-9 Score 4   Difficult doing work/chores Not difficult at all     Interpretation of Total Score  Total Score Depression Severity:  1-4 = Minimal depression, 5-9 = Mild depression, 10-14 = Moderate depression, 15-19 = Moderately severe depression, 20-27 = Severe depression   Psychosocial Evaluation and Intervention: Psychosocial Evaluation - 04/04/18 0919      Psychosocial Evaluation & Interventions   Interventions  Encouraged to exercise with the program and follow exercise prescription    Comments  Counselor met with Mr. Obryant Kissling) today for initial psychosocial evaluation.  He is a 77 year old who had several stents inserted due to blockages on 3/26.  He has a family history of heart disease.  Ludger has a strong support system with a spouse of 59 years; lives in a retirement community and is actively involved in his [employer].  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  Montford reports sleeping well most of the time with the use of a CPAP and a medication he takes at bedtime to help with anxiety and sleep.  He states his appetite is "too good" and he has goals to lose some weight while in this program.   Sergi has some anxiety but no depression and is able to manage with exercise and coping strategies and the sleep aid at night.  He has minimal stress in his life other than his health.  Lonzie is accustomed to walking 30 minutes each day for years and has goals to be educated on his exercise limits while in this program.  He is planning a trip with his spouse to [place] mid-May and wants to be ready for this.  Antoneo will be followed by staff throughout the course of this program.     Expected Outcomes  Short - Kendricks will meet with the dietician to address his weight loss goal.  Long - Aubert will exercise consistently and attend the educational components to recognize his exercise limits and increase his stamina.      Continue Psychosocial Services   Follow up required by staff       Psychosocial Re-Evaluation: Psychosocial Re-Evaluation    [place] Name 04/22/18 1047 06/03/18 0809           Psychosocial Re-Evaluation   Current issues with  Current Stress Concerns  Current Stress Concerns      Comments  Jolan is looking forward to his [place] trip!!  He will be gone for three weeks and hopes not to lose everything he has already gained.  He is doing well mentally and continues to try not to dwell on everything that has happened so far.  He was just shocked about how everything had happened to him, even though now looking back he should have been on the lookout given his strong family history.  He continues to sleep well with all of his medications  and found that couting backwards really helps ease him to sleep.   Monte enjoyed his trip to [place]! And then  just celebrated a family birthday in [place] last week.  He continues to do well mentatlly and staying postive.  He continues to sleep well and stays compliant with his CPAP.  He did not sleep on plane during their trip, they were flying for over 24 hours. He has enjoyed getting into the routine of exercise again and knowing that he is doing well.       Expected Outcomes  Short: Enjoy his [place] trip!!  Long: Continue to cope with his heart disease  postively.   Short: Continue to stay postive.  Long: Continue to stay compliant with CPAP and sleep well.       Interventions  Stress management education;Encouraged to attend Cardiac Rehabilitation for the exercise  Encouraged to attend Cardiac Rehabilitation for the exercise      Continue Psychosocial Services   Follow up required by staff  Follow up required by staff      Comments  He had prostate CA in 2008 and still stresses about a relapse of cancer of some type. States they have undergone some stress from a move 2 years ago from [place] area to [facility] and still are struggling to find their place here. He also struggles some socially and has stress since his event being able to be as active as he once was.   -        Initial Review   Source of Stress Concerns  Chronic Illness;Unable to participate in former interests or hobbies  -         Psychosocial Discharge (Final Psychosocial Re-Evaluation): Psychosocial Re-Evaluation - 06/03/18 0809      Psychosocial Re-Evaluation   Current issues with  Current Stress Concerns    Comments  Rawlin enjoyed his trip to [place]! And then just celebrated a family birthday in [place] last week.  He continues to do well mentatlly and staying postive.  He continues to sleep well and stays compliant with his CPAP.  He did not sleep on plane during their trip, they were flying for over 24 hours. He has enjoyed getting into the routine of exercise again and knowing that he is doing well.     Expected Outcomes  Short: Continue to stay  postive.  Long: Continue to stay compliant with CPAP and sleep well.     Interventions  Encouraged to attend Cardiac Rehabilitation for the exercise    Continue Psychosocial Services   Follow up required by staff       Vocational Rehabilitation: Provide vocational rehab assistance to qualifying candidates.   Vocational Rehab Evaluation & Intervention: Vocational Rehab - 03/31/18 1439      Initial Vocational Rehab Evaluation & Intervention   Assessment shows need for Vocational Rehabilitation  No       Education: Education Goals: Education classes will be provided on a variety of topics geared toward better understanding of heart health and risk factor modification. Participant will state understanding/return demonstration of topics presented as noted by education test scores.  Learning Barriers/Preferences: Learning Barriers/Preferences - 03/31/18 1437      Learning Barriers/Preferences   Learning Barriers  Sight;Hearing wears glasses and a hearing aid    Learning Preferences  Individual Instruction;Verbal Instruction;Skilled Demonstration       Education Topics:  AED/CPR: - Group verbal and written instruction with the use of models to demonstrate the basic use of the AED  with the basic ABC's of resuscitation.   General Nutrition Guidelines/Fats and Fiber: -Group instruction provided by verbal, written material, models and posters to present the general guidelines for heart healthy nutrition. Gives an explanation and review of dietary fats and fiber.   Controlling Sodium/Reading Food Labels: -Group verbal and written material supporting the discussion of sodium use in heart healthy nutrition. Review and explanation with models, verbal and written materials for utilization of the food label.   Exercise Physiology & General Exercise Guidelines: - Group verbal and written instruction with models to review the exercise physiology of the cardiovascular system and associated  critical values. Provides general exercise guidelines with specific guidelines to those with heart or lung disease.    Cardiac Rehab from 06/06/2018 in [facility] Cardiac and Pulmonary Rehab  Date  04/04/18  Educator  [facility]  Instruction Review Code  1- Verbalizes Understanding      Aerobic Exercise & Resistance Training: - Gives group verbal and written instruction on the various components of exercise. Focuses on aerobic and resistive training programs and the benefits of this training and how to safely progress through these programs..   Cardiac Rehab from 06/06/2018 in [facility] Cardiac and Pulmonary Rehab  Date  06/06/18  Educator  [facility]  Instruction Review Code  1- Geologist, engineering, Balance, Mind/Body Relaxation: Provides group verbal/written instruction on the benefits of flexibility and balance training, including mind/body exercise modes such as yoga, pilates and tai chi.  Demonstration and skill practice provided.   Cardiac Rehab from 06/06/2018 in [facility] Cardiac and Pulmonary Rehab  Date  04/11/18  Educator  [facility]  Instruction Review Code  1- Verbalizes Understanding      Stress and Anxiety: - Provides group verbal and written instruction about the health risks of elevated stress and causes of high stress.  Discuss the correlation between heart/lung disease and anxiety and treatment options. Review healthy ways to manage with stress and anxiety.   Cardiac Rehab from 06/06/2018 in [facility] Cardiac and Pulmonary Rehab  Date  04/20/18  Educator  [facility]  Instruction Review Code  1- Verbalizes Understanding      Depression: - Provides group verbal and written instruction on the correlation between heart/lung disease and depressed mood, treatment options, and the stigmas associated with seeking treatment.   Cardiac Rehab from 06/06/2018 in [facility] Cardiac and Pulmonary Rehab  Date  04/06/18  Educator  [facility]  Instruction Review Code  1- Verbalizes Understanding      Anatomy &  Physiology of the Heart: - Group verbal and written instruction and models provide basic cardiac anatomy and physiology, with the coronary electrical and arterial systems. Review of Valvular disease and Heart Failure   Cardiac Rehab from 06/06/2018 in [facility] Cardiac and Pulmonary Rehab  Date  04/18/18  Educator  [facility]  Instruction Review Code  1- Verbalizes Understanding      Cardiac Procedures: - Group verbal and written instruction to review commonly prescribed medications for heart disease. Reviews the medication, class of the drug, and side effects. Includes the steps to properly store meds and maintain the prescription regimen. (beta blockers and nitrates)   Cardiac Medications I: - Group verbal and written instruction to review commonly prescribed medications for heart disease. Reviews the medication, class of the drug, and side effects. Includes the steps to properly store meds and maintain the prescription regimen.   Cardiac Rehab from 06/06/2018 in [facility] Cardiac and Pulmonary Rehab  Date  04/25/18  Educator  SB  Instruction Review Code  1- Verbalizes Understanding      Cardiac Medications II: -Group verbal and written instruction to review commonly prescribed medications for heart disease. Reviews the medication, class of the drug, and side effects. (all other drug classes)   Cardiac Rehab from 06/06/2018 in [facility] Cardiac and Pulmonary Rehab  Date  05/25/18  Educator  SB  Instruction Review Code  1- Verbalizes Understanding       Go Sex-Intimacy & Heart Disease, Get SMART - Goal Setting: - Group verbal and written instruction through game format to discuss heart disease and the return to sexual intimacy. Provides group verbal and written material to discuss and apply goal setting through the application of the S.M.A.R.T. Method.   Other Matters of the Heart: - Provides group verbal, written materials and models to describe Stable Angina and Peripheral Artery. Includes description  of the disease process and treatment options available to the cardiac patient.   Cardiac Rehab from 06/06/2018 in [facility] Cardiac and Pulmonary Rehab  Date  04/18/18  Educator  SB  Instruction Review Code  1- Verbalizes Understanding      Exercise & Equipment Safety: - Individual verbal instruction and demonstration of equipment use and safety with use of the equipment.   Cardiac Rehab from 06/06/2018 in [facility] Cardiac and Pulmonary Rehab  Date  03/31/18  Educator  [facility]  Instruction Review Code  1- Verbalizes Understanding      Infection Prevention: - Provides verbal and written material to individual with discussion of infection control including proper hand washing and proper equipment cleaning during exercise session.   Cardiac Rehab from 06/06/2018 in [facility] Cardiac and Pulmonary Rehab  Date  03/31/18  Educator  [facility]  Instruction Review Code  1- Verbalizes Understanding      Falls Prevention: - Provides verbal and written material to individual with discussion of falls prevention and safety.   Cardiac Rehab from 06/06/2018 in [facility] Cardiac and Pulmonary Rehab  Date  03/31/18  Educator  [facility]  Instruction Review Code  1- Verbalizes Understanding      Diabetes: - Individual verbal and written instruction to review signs/symptoms of diabetes, desired ranges of glucose level fasting, after meals and with exercise. Acknowledge that pre and post exercise glucose checks will be done for 3 sessions at entry of program.   Know Your Numbers and Risk Factors: -Group verbal and written instruction about important numbers in your health.  Discussion of what are risk factors and how they play a role in the disease process.  Review of Cholesterol, Blood Pressure, Diabetes, and BMI and the role they play in your overall health.   Cardiac Rehab from 06/06/2018 in [facility] Cardiac and Pulmonary Rehab  Date  05/25/18  Educator  SB  Instruction Review Code  1- Verbalizes Understanding      Sleep  Hygiene: -Provides group verbal and written instruction about how sleep can affect your health.  Define sleep hygiene, discuss sleep cycles and impact of sleep habits. Review good sleep hygiene tips.    Other: -Provides group and verbal instruction on various topics (see comments)   Knowledge Questionnaire Score: Knowledge Questionnaire Score - 03/31/18 1438      Knowledge Questionnaire Score   Pre Score  23/28 correct answers reviewed with patient who verbalized understanding       Core Components/Risk Factors/Patient Goals at Admission: Personal Goals and Risk Factors at Admission - 03/31/18 1428      Core Components/Risk Factors/Patient Goals on Admission  Weight Management  Yes;Weight Loss    Intervention  Weight Management: Develop a combined nutrition and exercise program designed to reach desired caloric intake, while maintaining appropriate intake of nutrient and fiber, sodium and fats, and appropriate energy expenditure required for the weight goal.;Weight Management: Provide education and appropriate resources to help participant work on and attain dietary goals.;Weight Management/Obesity: Establish reasonable short term and long term weight goals.    Admit Weight  211 lb (95.7 kg)    Goal Weight: Short Term  205 lb (93 kg)    Goal Weight: Long Term  199 lb (90.3 kg)    Expected Outcomes  Short Term: Continue to assess and modify interventions until short term weight is achieved;Long Term: Adherence to nutrition and physical activity/exercise program aimed toward attainment of established weight goal;Weight Maintenance: Understanding of the daily nutrition guidelines, which includes 25-35% calories from fat, 7% or less cal from saturated fats, less than 228m cholesterol, less than 1.5gm of sodium, & 5 or more servings of fruits and vegetables daily;Weight Loss: Understanding of general recommendations for a balanced deficit meal plan, which promotes 1-2 lb weight loss per week  and includes a negative energy balance of [phone] kcal/d;Understanding of distribution of calorie intake throughout the day with the consumption of 4-5 meals/snacks;Understanding recommendations for meals to include 15-35% energy as protein, 25-35% energy from fat, 35-60% energy from carbohydrates, less than 20101mof dietary cholesterol, 20-35 gm of total fiber daily    Hypertension  Yes    Intervention  Provide education on lifestyle modifcations including regular physical activity/exercise, weight management, moderate sodium restriction and increased consumption of fresh fruit, vegetables, and low fat dairy, alcohol moderation, and smoking cessation.;Monitor prescription use compliance.    Expected Outcomes  Short Term: Continued assessment and intervention until BP is < 140/9083mG in hypertensive participants. < 130/36m30m in hypertensive participants with diabetes, heart failure or chronic kidney disease.;Long Term: Maintenance of blood pressure at goal levels.    Lipids  Yes elevated triglycerides    Intervention  Provide education and support for participant on nutrition & aerobic/resistive exercise along with prescribed medications to achieve LDL <70mg21mL >40mg.49mExpected Outcomes  Short Term: Participant states understanding of desired cholesterol values and is compliant with medications prescribed. Participant is following exercise prescription and nutrition guidelines.;Long Term: Cholesterol controlled with medications as prescribed, with individualized exercise RX and with personalized nutrition plan. Value goals: LDL < 70mg, 19m> 40 mg.    Stress  Yes    Intervention  Offer individual and/or small group education and counseling on adjustment to heart disease, stress management and health-related lifestyle change. Teach and support self-help strategies.;Refer participants experiencing significant psychosocial distress to appropriate mental health specialists for further evaluation and  treatment. When possible, include family members and significant others in education/counseling sessions.    Expected Outcomes  Short Term: Participant demonstrates changes in health-related behavior, relaxation and other stress management skills, ability to obtain effective social support, and compliance with psychotropic medications if prescribed.;Long Term: Emotional wellbeing is indicated by absence of clinically significant psychosocial distress or social isolation.    Personal Goal Other  Yes    Personal Goal  Return to playing pickle ball and bicycling.    Intervention  Will attent class work to improve stamina and endurance.     Expected Outcomes  He will be able to have enough stamina and endurance to return to playing pickle ball and go longer distances on his bike.  Core Components/Risk Factors/Patient Goals Review:  Goals and Risk Factor Review    Row Name 04/22/18 1041 06/03/18 0805           Core Components/Risk Factors/Patient Goals Review   Personal Goals Review  Weight Management/Obesity;Hypertension;Lipids  Weight Management/Obesity;Hypertension;Lipids      Review  Alwyn has only lost about 1 or 2 lbs since starting the program.  He would like to lose more, but is just now starting to get back his strength and stamina which will help him exercise more to help lose more weight.  He has been eating better as well.  His blood pressures have been doing well here in class.  He has not been checking it at home, but he does have a cuff and will start checking it more frequently at home.  He says that he feels his new medicaiton for his blood pressure seems to be working very well for him.  He is also trying to work with his IBS and so far has been able to remain in control of the symptoms.   Yoan has continued to lose weight.  He was even able to lose weight while on vacations.  He is not checking his blood pressures at home, but will start.  He is doing well on his medications.   His Imdur still causes headaches, he is going to talk to his doctor about maybe  changing to Ranexa.       Expected Outcomes  Short: Starting using cuff at home to monitor blood pressure.  Long: Continue to work on weight loss.   Short: Start checking blood pressures.  Long: Talk to doctor about changing to Ranexa         Core Components/Risk Factors/Patient Goals at Discharge (Final Review):  Goals and Risk Factor Review - 06/03/18 0805      Core Components/Risk Factors/Patient Goals Review   Personal Goals Review  Weight Management/Obesity;Hypertension;Lipids    Review  Cornell has continued to lose weight.  He was even able to lose weight while on vacations.  He is not checking his blood pressures at home, but will start.  He is doing well on his medications.  His Imdur still causes headaches, he is going to talk to his doctor about maybe  changing to Ranexa.     Expected Outcomes  Short: Start checking blood pressures.  Long: Talk to doctor about changing to Ranexa       ITP Comments: ITP Comments    Row Name 03/31/18 1424 04/04/18 0944 04/13/18 0611 04/22/18 1037 05/11/18 0545   ITP Comments  Medical Review Completed; initial ITP created. Diagnosis Documentation can be found in [place] encounter dated 03/23/2018.  Calil reported jaw pain on the elliptical today.  It is the same jaw pain that he was having prior to his stents. He has continued to have jaw pain since his stents, thus is on Imdur.  Today it got to a 3/10.  We talked about making sure he lets the staff know about it and not to exceed a 6/10 before taking a rest break.   30 day review. Continue with ITP unless directed changes per Medical Director  Hawk will be leaving next week to go on a trip to [place] for three weeks.  He will be out from 5/9-5/31 and will return in June.   30 day review. Continue with ITP unless directed changes per Medical Director   [place] Name 06/08/18 [phone]  ITP Comments  30 day  review. Continue with ITP unless directed changes per Medical Director review          Comments:

## 2018-06-10 VITALS — Ht 73.4 in | Wt 207.7 lb

## 2018-06-10 DIAGNOSIS — Z955 Presence of coronary angioplasty implant and graft: Secondary | ICD-10-CM

## 2018-06-10 DIAGNOSIS — Z7982 Long term (current) use of aspirin: Secondary | ICD-10-CM | POA: Diagnosis not present

## 2018-06-10 NOTE — Progress Notes (Signed)
Daily Session Note  Patient Details  Name: Richard Melton MRN: [id number] Date of Birth: 07/31/1941 Referring Provider:     Cardiac Rehab from 03/31/2018 in [facility] Cardiac and Pulmonary Rehab  Referring Provider  [name]      Encounter Date: 06/10/2018  Check In: Session Check In - 06/10/18 0830      Check-In   Location  [facility]-Cardiac & Pulmonary Rehab    Staff Present  Alberteen Sam, MA, RCEP, CCRP, Exercise Physiologist;Meredith Sherryll Burger, RN Vickki Hearing, BA, ACSM CEP, Exercise Physiologist    Supervising physician immediately available to respond to emergencies  See telemetry face sheet for immediately available ER MD    Medication changes reported      No    Fall or balance concerns reported     No    Warm-up and Cool-down  Performed on first and last piece of equipment    Resistance Training Performed  Yes    VAD Patient?  No      Pain Assessment   Currently in Pain?  No/denies    Multiple Pain Sites  No          Social History   Tobacco Use  Smoking Status Never Smoker  Smokeless Tobacco Never Used    Goals Met:  Independence with exercise equipment Exercise tolerated well No report of cardiac concerns or symptoms Strength training completed today  Goals Unmet:  Not Applicable  Comments: Pt able to follow exercise prescription today without complaint.  Will continue to monitor for progression. [place] Name 03/31/18 1446 06/10/18 0927       6 Minute Walk   Phase  -  Discharge    Distance  1542 feet  2000 feet    Distance % Change  -  29.7 %    Distance Feet Change  -  458 ft    Walk Time  6 minutes  6 minutes    # of Rest Breaks  0  0    MPH  2.92  3.79    METS  3.18  4.14    RPE  11  17    Perceived Dyspnea   0  -    VO2 Peak  11.12  14.48    Symptoms  Yes (comment)  Yes (comment)    Comments  jaw pain2/10 that resolved with rest - has spoken with Dr - not a new s/s  jaw pain2/10 that resolved with rest    Resting HR  60 bpm   58 bpm    Resting BP  138/60  140/80    Resting Oxygen Saturation   98 %  -    Exercise Oxygen Saturation  during 6 min walk  99 %  -    Max Ex. HR  96 bpm  120 bpm    Max Ex. BP  142/66  128/72    2 Minute Post BP  118/56  -         Dr. Emily Filbert is Medical Director for [place] and [facility].

## 2018-06-13 ENCOUNTER — Encounter: Payer: Medicare Other | Admitting: *Deleted

## 2018-06-13 DIAGNOSIS — Z7982 Long term (current) use of aspirin: Secondary | ICD-10-CM | POA: Diagnosis not present

## 2018-06-13 DIAGNOSIS — Z955 Presence of coronary angioplasty implant and graft: Secondary | ICD-10-CM

## 2018-06-13 NOTE — Progress Notes (Signed)
Daily Session Note  Patient Details  Name: Richard Melton MRN: [id number] Date of Birth: Dec 14, 1941 Referring Provider:     Cardiac Rehab from 03/31/2018 in [facility] Cardiac and Pulmonary Rehab  Referring Provider  [name]      Encounter Date: 06/13/2018  Check In: Session Check In - 06/13/18 0759      Check-In   Location  [facility]-Cardiac & Pulmonary Rehab    Staff Present  Gerlene Burdock, RN, Moises Blood, BS, ACSM CEP, Exercise Physiologist;Makaiah Terwilliger Luan Pulling, [place], RCEP, CCRP, Exercise Physiologist    Supervising physician immediately available to respond to emergencies  See telemetry face sheet for immediately available ER MD    Medication changes reported      No    Fall or balance concerns reported     No    Warm-up and Cool-down  Performed on first and last piece of equipment    Resistance Training Performed  Yes    VAD Patient?  No      Pain Assessment   Currently in Pain?  No/denies          Social History   Tobacco Use  Smoking Status Never Smoker  Smokeless Tobacco Never Used    Goals Met:  Independence with exercise equipment Exercise tolerated well No report of cardiac concerns or symptoms Strength training completed today  Goals Unmet:  Not Applicable  Comments: Pt able to follow exercise prescription today without complaint.  Will continue to monitor for progression.    Dr. Emily Filbert is Medical Director for [place] and [facility].

## 2018-06-15 ENCOUNTER — Encounter: Payer: Medicare Other | Admitting: *Deleted

## 2018-06-15 DIAGNOSIS — Z955 Presence of coronary angioplasty implant and graft: Secondary | ICD-10-CM

## 2018-06-15 DIAGNOSIS — Z7982 Long term (current) use of aspirin: Secondary | ICD-10-CM | POA: Diagnosis not present

## 2018-06-15 NOTE — Progress Notes (Signed)
Daily Session Note  Patient Details  Name: Richard Melton MRN: [id number] Date of Birth: 12-04-1941 Referring Provider:     Cardiac Rehab from 03/31/2018 in [facility] Cardiac and Pulmonary Rehab  Referring Provider  [name]      Encounter Date: 06/15/2018  Check In: Session Check In - 06/15/18 0811      Check-In   Location  [facility]-Cardiac & Pulmonary Rehab    Staff Present  Heath Lark, RN, BSN, CCRP;Shyia Fillingim Luan Pulling, MA, RCEP, CCRP, Exercise Physiologist;Joseph Flavia Shipper    Supervising physician immediately available to respond to emergencies  See telemetry face sheet for immediately available ER MD    Medication changes reported      No    Fall or balance concerns reported     No    Warm-up and Cool-down  Performed on first and last piece of equipment    Resistance Training Performed  Yes    VAD Patient?  No    PAD/SET Patient?  No      Pain Assessment   Currently in Pain?  No/denies        Exercise Prescription Changes - 06/14/18 1400      Response to Exercise   Blood Pressure (Admit)  142/78    Blood Pressure (Exercise)  162/80    Blood Pressure (Exit)  124/62    Heart Rate (Admit)  60 bpm    Heart Rate (Exercise)  100 bpm    Heart Rate (Exit)  59 bpm    Rating of Perceived Exertion (Exercise)  12    Symptoms  none    Duration  Continue with 30 min of aerobic exercise without signs/symptoms of physical distress.    Intensity  THRR unchanged      Progression   Progression  Continue to progress workloads to maintain intensity without signs/symptoms of physical distress.    Average METs  3.16      Resistance Training   Training Prescription  Yes    Weight  4 lbs    Reps  10-15      Interval Training   Interval Training  No      Treadmill   MPH  3    Grade  1    Minutes  15    METs  3.71      Arm Ergometer   Level  1    Minutes  15    METs  2.6      Elliptical   Level  1    Speed  3.2    Minutes  15      Home Exercise Plan   Plans to  continue exercise at  [employer] (comment) [place] and walking    Frequency  Add 2 additional days to program exercise sessions.    Initial Home Exercises Provided  04/08/18       Social History   Tobacco Use  Smoking Status Never Smoker  Smokeless Tobacco Never Used    Goals Met:  Independence with exercise equipment Exercise tolerated well No report of cardiac concerns or symptoms Strength training completed today  Goals Unmet:  Not Applicable  Comments: Pt able to follow exercise prescription today without complaint.  Will continue to monitor for progression.    Dr. Emily Filbert is Medical Director for [place] and [facility].

## 2018-06-17 DIAGNOSIS — Z955 Presence of coronary angioplasty implant and graft: Secondary | ICD-10-CM

## 2018-06-17 DIAGNOSIS — Z7982 Long term (current) use of aspirin: Secondary | ICD-10-CM | POA: Diagnosis not present

## 2018-06-17 NOTE — Patient Instructions (Signed)
Discharge Patient Instructions  Patient Details  Name: Richard Melton MRN: [id number] Date of Birth: 22-May-1941 Referring Provider:  Isaias Cowman, MD   Number of Visits: 85  Reason for Discharge:  Patient reached a stable level of exercise. Patient independent in their exercise. Patient has met program and personal goals.  Smoking History:  Social History   Tobacco Use  Smoking Status Never Smoker  Smokeless Tobacco Never Used    Diagnosis:  Status post coronary artery stent placement  Initial Exercise Prescription: Initial Exercise Prescription - 03/31/18 1400      Date of Initial Exercise RX and Referring Provider   Date  03/31/18    Referring Provider  [name]      Treadmill   MPH  2.8    Grade  0    Minutes  15    METs  3.14      Arm Ergometer   Level  2    Watts  50    RPM  40    Minutes  15    METs  3      Recumbant Elliptical   Level  3    RPM  50    Minutes  15    METs  3      Prescription Details   Frequency (times per week)  3    Duration  Progress to 45 minutes of aerobic exercise without signs/symptoms of physical distress      Intensity   THRR 40-80% of Max Heartrate  94-128    Ratings of Perceived Exertion  11-15    Perceived Dyspnea  0-4      Resistance Training   Training Prescription  Yes    Weight  4 lb    Reps  10-15       Discharge Exercise Prescription (Final Exercise Prescription Changes): Exercise Prescription Changes - 06/14/18 1400      Response to Exercise   Blood Pressure (Admit)  142/78    Blood Pressure (Exercise)  162/80    Blood Pressure (Exit)  124/62    Heart Rate (Admit)  60 bpm    Heart Rate (Exercise)  100 bpm    Heart Rate (Exit)  59 bpm    Rating of Perceived Exertion (Exercise)  12    Symptoms  none    Duration  Continue with 30 min of aerobic exercise without signs/symptoms of physical distress.    Intensity  THRR unchanged      Progression   Progression  Continue to progress  workloads to maintain intensity without signs/symptoms of physical distress.    Average METs  3.16      Resistance Training   Training Prescription  Yes    Weight  4 lbs    Reps  10-15      Interval Training   Interval Training  No      Treadmill   MPH  3    Grade  1    Minutes  15    METs  3.71      Arm Ergometer   Level  1    Minutes  15    METs  2.6      Elliptical   Level  1    Speed  3.2    Minutes  15      Home Exercise Plan   Plans to continue exercise at  [employer] (comment) [place] and walking    Frequency  Add 2 additional days to program  exercise sessions.    Initial Home Exercises Provided  04/08/18       Functional Capacity: 6 Minute Walk    Row Name 03/31/18 1446 06/10/18 0927       6 Minute Walk   Phase  -  Discharge    Distance  1542 feet  2000 feet    Distance % Change  -  29.7 %    Distance Feet Change  -  458 ft    Walk Time  6 minutes  6 minutes    # of Rest Breaks  0  0    MPH  2.92  3.79    METS  3.18  4.14    RPE  11  17    Perceived Dyspnea   0  -    VO2 Peak  11.12  14.48    Symptoms  Yes (comment)  Yes (comment)    Comments  jaw pain2/10 that resolved with rest - has spoken with Dr - not a new s/s  jaw pain2/10 that resolved with rest    Resting HR  60 bpm  58 bpm    Resting BP  138/60  140/80    Resting Oxygen Saturation   98 %  -    Exercise Oxygen Saturation  during 6 min walk  99 %  -    Max Ex. HR  96 bpm  120 bpm    Max Ex. BP  142/66  128/72    2 Minute Post BP  118/56  -       Quality of Life: Quality of Life - 06/08/18 1142      Quality of Life Scores   Health/Function Pre  19.92 %    Health/Function Post  25.5 %    Health/Function % Change  28.01 %    Socioeconomic Pre  25 %    Socioeconomic Post  28.43 %    Socioeconomic % Change   13.72 %    Psych/Spiritual Pre  20.43 %    Psych/Spiritual Post  27.07 %    Psych/Spiritual % Change  32.5 %    Family Pre  24 %    Family Post  25.25 %     Family % Change  5.21 %    GLOBAL Pre  21.71 %    GLOBAL Post  26.42 %    GLOBAL % Change  21.7 %       Personal Goals: Goals established at orientation with interventions provided to work toward goal. Personal Goals and Risk Factors at Admission - 03/31/18 1428      Core Components/Risk Factors/Patient Goals on Admission    Weight Management  Yes;Weight Loss    Intervention  Weight Management: Develop a combined nutrition and exercise program designed to reach desired caloric intake, while maintaining appropriate intake of nutrient and fiber, sodium and fats, and appropriate energy expenditure required for the weight goal.;Weight Management: Provide education and appropriate resources to help participant work on and attain dietary goals.;Weight Management/Obesity: Establish reasonable short term and long term weight goals.    Admit Weight  211 lb (95.7 kg)    Goal Weight: Short Term  205 lb (93 kg)    Goal Weight: Long Term  199 lb (90.3 kg)    Expected Outcomes  Short Term: Continue to assess and modify interventions until short term weight is achieved;Long Term: Adherence to nutrition and physical activity/exercise program aimed toward attainment of established weight goal;Weight Maintenance: Understanding of the daily  nutrition guidelines, which includes 25-35% calories from fat, 7% or less cal from saturated fats, less than 286m cholesterol, less than 1.5gm of sodium, & 5 or more servings of fruits and vegetables daily;Weight Loss: Understanding of general recommendations for a balanced deficit meal plan, which promotes 1-2 lb weight loss per week and includes a negative energy balance of [phone] kcal/d;Understanding of distribution of calorie intake throughout the day with the consumption of 4-5 meals/snacks;Understanding recommendations for meals to include 15-35% energy as protein, 25-35% energy from fat, 35-60% energy from carbohydrates, less than 2050mof dietary cholesterol, 20-35 gm of  total fiber daily    Hypertension  Yes    Intervention  Provide education on lifestyle modifcations including regular physical activity/exercise, weight management, moderate sodium restriction and increased consumption of fresh fruit, vegetables, and low fat dairy, alcohol moderation, and smoking cessation.;Monitor prescription use compliance.    Expected Outcomes  Short Term: Continued assessment and intervention until BP is < 140/9020mG in hypertensive participants. < 130/22m84m in hypertensive participants with diabetes, heart failure or chronic kidney disease.;Long Term: Maintenance of blood pressure at goal levels.    Lipids  Yes elevated triglycerides    Intervention  Provide education and support for participant on nutrition & aerobic/resistive exercise along with prescribed medications to achieve LDL <70mg68mL >40mg.58mExpected Outcomes  Short Term: Participant states understanding of desired cholesterol values and is compliant with medications prescribed. Participant is following exercise prescription and nutrition guidelines.;Long Term: Cholesterol controlled with medications as prescribed, with individualized exercise RX and with personalized nutrition plan. Value goals: LDL < 70mg, 54m> 40 mg.    Stress  Yes    Intervention  Offer individual and/or small group education and counseling on adjustment to heart disease, stress management and health-related lifestyle change. Teach and support self-help strategies.;Refer participants experiencing significant psychosocial distress to appropriate mental health specialists for further evaluation and treatment. When possible, include family members and significant others in education/counseling sessions.    Expected Outcomes  Short Term: Participant demonstrates changes in health-related behavior, relaxation and other stress management skills, ability to obtain effective social support, and compliance with psychotropic medications if prescribed.;Long  Term: Emotional wellbeing is indicated by absence of clinically significant psychosocial distress or social isolation.    Personal Goal Other  Yes    Personal Goal  Return to playing pickle ball and bicycling.    Intervention  Will attent class work to improve stamina and endurance.     Expected Outcomes  He will be able to have enough stamina and endurance to return to playing pickle ball and go longer distances on his bike.         Personal Goals Discharge: Goals and Risk Factor Review - 06/03/18 0805      Core Components/Risk Factors/Patient Goals Review   Personal Goals Review  Weight Management/Obesity;Hypertension;Lipids    Review  Winferd Elsterntinued to lose weight.  He was even able to lose weight while on vacations.  He is not checking his blood pressures at home, but will start.  He is doing well on his medications.  His Imdur still causes headaches, he is going to talk to his doctor about maybe  changing to [place].     Expected Outcomes  Short: Start checking blood pressures.  Long: Talk to doctor about changing to [place]a       Exercise Goals and Review: Exercise Goals    Row Name 03/31/18 1445  Exercise Goals   Increase Physical Activity  Yes       Intervention  Provide advice, education, support and counseling about physical activity/exercise needs.;Develop an individualized exercise prescription for aerobic and resistive training based on initial evaluation findings, risk stratification, comorbidities and participant's personal goals.       Expected Outcomes  Short Term: Attend rehab on a regular basis to increase amount of physical activity.;Long Term: Add in home exercise to make exercise part of routine and to increase amount of physical activity.;Long Term: Exercising regularly at least 3-5 days a week.       Increase Strength and Stamina  Yes       Intervention  Provide advice, education, support and counseling about physical activity/exercise  needs.;Develop an individualized exercise prescription for aerobic and resistive training based on initial evaluation findings, risk stratification, comorbidities and participant's personal goals.       Expected Outcomes  Short Term: Increase workloads from initial exercise prescription for resistance, speed, and METs.;Short Term: Perform resistance training exercises routinely during rehab and add in resistance training at home;Long Term: Improve cardiorespiratory fitness, muscular endurance and strength as measured by increased METs and functional capacity (6MWT)       Able to understand and use rate of perceived exertion (RPE) scale  Yes       Intervention  Provide education and explanation on how to use RPE scale       Expected Outcomes  Short Term: Able to use RPE daily in rehab to express subjective intensity level;Long Term:  Able to use RPE to guide intensity level when exercising independently       Able to understand and use Dyspnea scale  Yes       Intervention  Provide education and explanation on how to use Dyspnea scale       Expected Outcomes  Short Term: Able to use Dyspnea scale daily in rehab to express subjective sense of shortness of breath during exertion;Long Term: Able to use Dyspnea scale to guide intensity level when exercising independently       Knowledge and understanding of Target Heart Rate Range (THRR)  Yes       Intervention  Provide education and explanation of THRR including how the numbers were predicted and where they are located for reference       Expected Outcomes  Short Term: Able to state/look up THRR;Short Term: Able to use daily as guideline for intensity in rehab;Long Term: Able to use THRR to govern intensity when exercising independently       Able to check pulse independently  Yes       Intervention  Provide education and demonstration on how to check pulse in carotid and radial arteries.;Review the importance of being able to check your own pulse for safety  during independent exercise       Expected Outcomes  Short Term: Able to explain why pulse checking is important during independent exercise;Long Term: Able to check pulse independently and accurately       Understanding of Exercise Prescription  Yes       Intervention  Provide education, explanation, and written materials on patient's individual exercise prescription       Expected Outcomes  Short Term: Able to explain program exercise prescription;Long Term: Able to explain home exercise prescription to exercise independently          Nutrition & Weight - Outcomes: Pre Biometrics - 03/31/18 1444      Pre Biometrics  Height  6' 1.4" (1.864 m)    Weight  211 lb 1.6 oz (95.8 kg)    Waist Circumference  41.5 inches    Hip Circumference  40 inches    Waist to Hip Ratio  1.04 %    BMI (Calculated)  27.56      Post Biometrics - 06/10/18 0928       Post  Biometrics   Height  6' 1.4" (1.864 m)    Weight  207 lb 11.2 oz (94.2 kg)    Waist Circumference  39 inches    Hip Circumference  40 inches    Waist to Hip Ratio  0.98 %    BMI (Calculated)  27.12    Single Leg Stand  13.34 seconds       Nutrition: Nutrition Therapy & Goals - 04/18/18 1006      Nutrition Therapy   Diet  TLC/ Heart Healthy    Protein (specify units)  9oz    Fiber  35 grams    Whole Grain Foods  3 servings    Saturated Fats  16 max. grams    Fruits and Vegetables  6 servings/day 8 ideal    Sodium  2000 grams      Personal Nutrition Goals   Nutrition Goal  Continue to practice choosing lower-sodium food and beverage options. Try not to add salt to meals after cooking, choose salt-free seasonings instead    Personal Goal #2  Practice choosing foods from different food groups when puting together a lunch meal, for example: a lean protein + 1 serving of heart healthy fat + a starch (baked potato, rice, vegetble, fruit). include dairy when possible    Personal Goal #3  Choose foods high in fiber, including  fruits, vegetables, whole grains, beans and skins of foods like potatoes    Comments  He and his wife have been working towards eating a heart-healthy diet. They typically do not cook with salt and look for heart-healthy options when eating out. He does still add salt to meals on occasion      Intervention Plan   Intervention  Prescribe, educate and counsel regarding individualized specific dietary modifications aiming towards targeted core components such as weight, hypertension, lipid management, diabetes, heart failure and other comorbidities.    Expected Outcomes  Short Term Goal: Understand basic principles of dietary content, such as calories, fat, sodium, cholesterol and nutrients.;Long Term Goal: Adherence to prescribed nutrition plan.;Short Term Goal: A plan has been developed with personal nutrition goals set during dietitian appointment.       Nutrition Discharge: Nutrition Assessments - 06/08/18 1142      MEDFICTS Scores   Pre Score  30    Post Score  26    Score Difference  -4       Education Questionnaire Score: Knowledge Questionnaire Score - 06/08/18 1142      Knowledge Questionnaire Score   Pre Score  23/28    Post Score  25/28       Goals reviewed with patient; copy given to patient.

## 2018-06-17 NOTE — Progress Notes (Signed)
Daily Session Note  Patient Details  Name: Richard Melton MRN: [id number] Date of Birth: June 03, 1941 Referring Provider:     Cardiac Rehab from 03/31/2018 in [facility] Cardiac and Pulmonary Rehab  Referring Provider  [name]      Encounter Date: 06/17/2018  Check In: Session Check In - 06/17/18 0836      Check-In   Location  [facility]-Cardiac & Pulmonary Rehab    Staff Present  Alberteen Sam, MA, [place], CCRP, Exercise Physiologist;Meredith Sherryll Burger, RN Vickki Hearing, BA, ACSM CEP, Exercise Physiologist    Supervising physician immediately available to respond to emergencies  See telemetry face sheet for immediately available ER MD    Medication changes reported      No    Fall or balance concerns reported     No    Warm-up and Cool-down  Performed on first and last piece of equipment    Resistance Training Performed  Yes    VAD Patient?  No    PAD/SET Patient?  No      Pain Assessment   Currently in Pain?  No/denies    Multiple Pain Sites  No          Social History   Tobacco Use  Smoking Status Never Smoker  Smokeless Tobacco Never Used    Goals Met:  Independence with exercise equipment Exercise tolerated well No report of cardiac concerns or symptoms Strength training completed today  Goals Unmet:  Not Applicable  Comments: Pt able to follow exercise prescription today without complaint.  Will continue to monitor for progression.    Dr. Emily Filbert is Medical Director for [place] and [facility].

## 2018-06-20 ENCOUNTER — Encounter: Payer: Medicare Other | Attending: Cardiology | Admitting: *Deleted

## 2018-06-20 DIAGNOSIS — E785 Hyperlipidemia, unspecified: Secondary | ICD-10-CM | POA: Insufficient documentation

## 2018-06-20 DIAGNOSIS — I10 Essential (primary) hypertension: Secondary | ICD-10-CM | POA: Diagnosis not present

## 2018-06-20 DIAGNOSIS — Z7982 Long term (current) use of aspirin: Secondary | ICD-10-CM | POA: Diagnosis not present

## 2018-06-20 DIAGNOSIS — Z955 Presence of coronary angioplasty implant and graft: Secondary | ICD-10-CM | POA: Diagnosis present

## 2018-06-20 DIAGNOSIS — Z79899 Other long term (current) drug therapy: Secondary | ICD-10-CM | POA: Diagnosis not present

## 2018-06-20 NOTE — Progress Notes (Signed)
Daily Session Note  Patient Details  Name: Richard Melton MRN: [id number] Date of Birth: 1941/01/28 Referring Provider:     Cardiac Rehab from 03/31/2018 in [facility]iac [facility]  Referring Provider  [name]      Encounter Date: 06/20/2018  Check In: Session Check In - 06/20/18 0805      Check-In   Location  [facility]-Cardiac & Pulmonary Rehab    Staff Present  [name], BS, PEC;[name], RN, BSN, CCRP;[name] [place], [place], RCEP, CCRP, Exercise Physiologist    Supervising physician immediately available to respond to emergencies  See telemetry face sheet for immediately available ER MD    Medication changes reported      No    Fall or balance concerns reported     No    Warm-up and Cool-down  Performed on first and last piece of equipment    Resistance Training Performed  Yes    VAD Patient?  No    PAD/SET Patient?  No      Pain Assessment   Currently in Pain?  No/denies          Social History   Tobacco Use  Smoking Status Never Smoker  Smokeless Tobacco Never Used    Goals Met:  Independence with exercise equipment Exercise tolerated well Personal goals reviewed No report of cardiac concerns or symptoms Strength training completed today  Goals Unmet:  Not Applicable  Comments:  Richard Melton graduated today from  rehab with 36 sessions completed.  Details of the patient's exercise prescription and what He needs to do in order to continue the prescription and progress were discussed with patient.  Patient was given a copy of prescription and goals.  Patient verbalized understanding.  Richard Melton plans to continue to exercise by walking at home.    Dr. Emily Filbert is Medical Director for [facility] and [facility].

## 2018-06-20 NOTE — Progress Notes (Signed)
Discharge Progress Report  Patient Details  Name: Richard Melton MRN: [id number] Date of Birth: 01-Jan-1941 Referring Provider:     Cardiac Rehab from 03/31/2018 in [facility] [place] [facility] Cardiac [facility] Pul[facility] Re[facility]  Referring Provider  [name]       Number of Visits: 76  Reason for Discharge:  Patient reached a stable level of exercise. Patient independent in their exercise. Patient has met program and personal goals.  Smoking History:  Social History   Tobacco Use  Smoking Status Never Smoker  Smokeless Tobacco Never Used    Diagnosis:  Status post coronary artery stent placement  ADL [facility]:   Initial Exercise Prescription: Initial Exercise Prescription - 03/31/18 1400      Date of Initial Exercise RX and Referring Provider   Date  03/31/18    Referring Provider  [name]      Treadmill   MPH  2.8    Grade  0    Minutes  15    METs  3.14      Arm Ergometer   Level  2    Watts  50    RPM  40    Minutes  15    METs  3      Recumbant Elliptical   Level  3    RPM  50    Minutes  15    METs  3      Prescription Details   Frequency (times per week)  3    Duration  Progress to 45 minutes of aerobic exercise without signs/symptoms of physical distress      Intensity   THRR 40-80% of Max Heartrate  94-128    Ratings of Perceived Exertion  11-15    Perceived Dyspnea  0-4      Resistance Training   Training Prescription  Yes    Weight  4 lb    Reps  10-15       Discharge Exercise Prescription (Final Exercise Prescription Changes): Exercise Prescription Changes - 06/14/18 1400      Response to Exercise   Blood Pressure (Admit)  142/78    Blood Pressure (Exercise)  162/80    Blood Pressure (Exit)  124/62    Heart Rate (Admit)  60 bpm    Heart Rate (Exercise)  100 bpm    Heart Rate (Exit)  59 bpm    Rating of Perceived Exertion (Exercise)  12    Symptoms  none    Duration  Continue with 30 min of aerobic exercise without signs/symptoms of physical distress.     Intensity  THRR unchanged      Progression   Progression  Continue to progress workloads to maintain intensity without signs/symptoms of physical distress.    Average METs  3.16      Resistance Training   Training Prescription  Yes    Weight  4 lbs    Reps  10-15      Interval Training   Interval Training  No      Treadmill   MPH  3    Grade  1    Minutes  15    METs  3.71      Arm Ergometer   Level  1    Minutes  15    METs  2.6      Elliptical   Level  1    Speed  3.2    Minutes  15      Home Exercise Plan   Plans to  continue exercise at  [employer] (comment) [place] and walking    Frequency  Add 2 additional days to program exercise sessions.    Initial Home Exercises Provided  04/08/18       Functional Capacity: 6 Minute Walk    Row Name 03/31/18 1446 06/10/18 0927       6 Minute Walk   Phase  -  Discharge    Distance  1542 feet  2000 feet    Distance % Change  -  29.7 %    Distance Feet Change  -  458 ft    Walk Time  6 minutes  6 minutes    # of Rest Breaks  0  0    MPH  2.92  3.79    METS  3.18  4.14    RPE  11  17    Perceived Dyspnea   0  -    VO2 Peak  11.12  14.48    Symptoms  Yes (comment)  Yes (comment)    Comments  jaw pain2/10 that resolved with rest - has spoken with Dr - not a new s/s  jaw pain2/10 that resolved with rest    Resting HR  60 bpm  58 bpm    Resting BP  138/60  140/80    Resting Oxygen Saturation   98 %  -    Exercise Oxygen Saturation  during 6 min walk  99 %  -    Max Ex. HR  96 bpm  120 bpm    Max Ex. BP  142/66  128/72    2 Minute Post BP  118/56  -       Psychological, QOL, Others - Outcomes: PHQ 2/9: Depression screen [facility] 2/9 06/08/2018 03/31/2018  Decreased Interest 0 0  Down, Depressed, Hopeless 0 0  PHQ - 2 Score 0 0  Altered sleeping 0 0  Tired, decreased energy 1 1  Change in appetite 0 2  Feeling bad or failure about yourself  0 1  Trouble concentrating 1 0  Moving slowly or  fidgety/restless 0 0  Suicidal thoughts 0 0  PHQ-9 Score 2 4  Difficult doing work/chores Not difficult at all Not difficult at all    Quality of Life: Quality of Life - 06/08/18 1142      Quality of Life Scores   Health/Function Pre  19.92 %    Health/Function Post  25.5 %    Health/Function % Change  28.01 %    Socioeconomic Pre  25 %    Socioeconomic Post  28.43 %    Socioeconomic % Change   13.72 %    Psych/Spiritual Pre  20.43 %    Psych/Spiritual Post  27.07 %    Psych/Spiritual % Change  32.5 %    Family Pre  24 %    Family Post  25.25 %    Family % Change  5.21 %    GLOBAL Pre  21.71 %    GLOBAL Post  26.42 %    GLOBAL % Change  21.7 %       Personal Goals: Goals established at orientation with interventions provided to work toward goal. Personal Goals and Risk Factors at Admission - 03/31/18 1428      Core Components/Risk Factors/Patient Goals on Admission    Weight Management  Yes;Weight Loss    Intervention  Weight Management: Develop a combined nutrition and exercise program designed to reach desired caloric intake, while  maintaining appropriate intake of nutrient and fiber, sodium and fats, and appropriate energy expenditure required for the weight goal.;Weight Management: Provide education and appropriate resources to help participant work on and attain dietary goals.;Weight Management/Obesity: Establish reasonable short term and long term weight goals.    Admit Weight  211 lb (95.7 kg)    Goal Weight: Short Term  205 lb (93 kg)    Goal Weight: Long Term  199 lb (90.3 kg)    Expected Outcomes  Short Term: Continue to assess and modify interventions until short term weight is achieved;Long Term: Adherence to nutrition and physical activity/exercise program aimed toward attainment of established weight goal;Weight Maintenance: Understanding of the daily nutrition guidelines, which includes 25-35% calories from fat, 7% or less cal from saturated fats, less than 223m  cholesterol, less than 1.5gm of sodium, & 5 or more servings of fruits and vegetables daily;Weight Loss: Understanding of general recommendations for a balanced deficit meal plan, which promotes 1-2 lb weight loss per week and includes a negative energy balance of [phone] kcal/d;Understanding of distribution of calorie intake throughout the day with the consumption of 4-5 meals/snacks;Understanding recommendations for meals to include 15-35% energy as protein, 25-35% energy from fat, 35-60% energy from carbohydrates, less than 2049mof dietary cholesterol, 20-35 gm of total fiber daily    Hypertension  Yes    Intervention  Provide education on lifestyle modifcations including regular physical activity/exercise, weight management, moderate sodium restriction and increased consumption of fresh fruit, vegetables, and low fat dairy, alcohol moderation, and smoking cessation.;Monitor prescription use compliance.    Expected Outcomes  Short Term: Continued assessment and intervention until BP is < 140/9098mG in hypertensive participants. < 130/40m78m in hypertensive participants with diabetes, heart failure or chronic kidney disease.;Long Term: Maintenance of blood pressure at goal levels.    Lipids  Yes elevated triglycerides    Intervention  Provide education and support for participant on nutrition & aerobic/resistive exercise along with prescribed medications to achieve LDL <70mg63mL >40mg.105mExpected Outcomes  Short Term: Participant states understanding of desired cholesterol values and is compliant with medications prescribed. Participant is following exercise prescription and nutrition guidelines.;Long Term: Cholesterol controlled with medications as prescribed, with individualized exercise RX and with personalized nutrition plan. Value goals: LDL < 70mg, 78m> 40 mg.    Stress  Yes    Intervention  Offer individual and/or small group education and counseling on adjustment to heart disease, stress  management and health-related lifestyle change. Teach and support self-help strategies.;Refer participants experiencing significant psychosocial distress to appropriate mental health specialists for further evaluation and treatment. When possible, include family members and significant others in education/counseling sessions.    Expected Outcomes  Short Term: Participant demonstrates changes in health-related behavior, relaxation and other stress management skills, ability to obtain effective social support, and compliance with psychotropic medications if prescribed.;Long Term: Emotional wellbeing is indicated by absence of clinically significant psychosocial distress or social isolation.    Personal Goal Other  Yes    Personal Goal  Return to playing pickle ball and bicycling.    Intervention  Will attent class work to improve stamina and endurance.     Expected Outcomes  He will be able to have enough stamina and endurance to return to playing pickle ball and go longer distances on his bike.         Personal Goals Discharge: Goals and Risk Factor Review    Row Name 04/22/18 1041 06/03/18 0805  Core Components/Risk Factors/Patient Goals Review   Personal Goals Review  Weight Management/Obesity;Hypertension;Lipids  Weight Management/Obesity;Hypertension;Lipids      Review  Royer has only lost about 1 or 2 lbs since starting the program.  He would like to lose more, but is just now starting to get back his strength and stamina which will help him exercise more to help lose more weight.  He has been eating better as well.  His blood pressures have been doing well here in class.  He has not been checking it at home, but he does have a cuff and will start checking it more frequently at home.  He says that he feels his new medicaiton for his blood pressure seems to be working very well for him.  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   Slater has continued to lose weight.  He was even able to lose weight while on vacations.  He is not checking his blood pressures at home, but will start.  He is doing well on his medications.  His Imdur still causes headaches, he is going to talk to his doctor about maybe  changing to Ranexa.       Expected Outcomes  Short: Starting using cuff at home to monitor blood pressure.  Long: Continue to work on weight loss.   Short: Start checking blood pressures.  Long: Talk to doctor about changing to Ranexa         Exercise Goals and Review: Exercise Goals    Row Name 03/31/18 1445             Exercise Goals   Increase Physical Activity  Yes       Intervention  Provide advice, education, support and counseling about physical activity/exercise needs.;Develop an individualized exercise prescription for aerobic and resistive training based on initial evaluation findings, risk stratification, comorbidities and participant's personal goals.       Expected Outcomes  Short Term: Attend rehab on a regular basis to increase amount of physical activity.;Long Term: Add in home exercise to make exercise part of routine and to increase amount of physical activity.;Long Term: Exercising regularly at least 3-5 days a week.       Increase Strength and Stamina  Yes       Intervention  Provide advice, education, support and counseling about physical activity/exercise needs.;Develop an individualized exercise prescription for aerobic and resistive training based on initial evaluation findings, risk stratification, comorbidities and participant's personal goals.       Expected Outcomes  Short Term: Increase workloads from initial exercise prescription for resistance, speed, and METs.;Short Term: Perform resistance training exercises routinely during rehab and add in resistance training at home;Long Term: Improve cardiorespiratory fitness, muscular endurance and strength as measured by increased METs and  functional capacity (6MWT)       Able to understand and use rate of perceived exertion (RPE) scale  Yes       Intervention  Provide education and explanation on how to use RPE scale       Expected Outcomes  Short Term: Able to use RPE daily in rehab to express subjective intensity level;Long Term:  Able to use RPE to guide intensity level when exercising independently       Able to understand and use Dyspnea scale  Yes       Intervention  Provide education and explanation on how to use Dyspnea scale  Expected Outcomes  Short Term: Able to use Dyspnea scale daily in rehab to express subjective sense of shortness of breath during exertion;Long Term: Able to use Dyspnea scale to guide intensity level when exercising independently       Knowledge and understanding of Target Heart Rate Range (THRR)  Yes       Intervention  Provide education and explanation of THRR including how the numbers were predicted and where they are located for reference       Expected Outcomes  Short Term: Able to state/look up THRR;Short Term: Able to use daily as guideline for intensity in rehab;Long Term: Able to use THRR to govern intensity when exercising independently       Able to check pulse independently  Yes       Intervention  Provide education and demonstration on how to check pulse in carotid and radial arteries.;Review the importance of being able to check your own pulse for safety during independent exercise       Expected Outcomes  Short Term: Able to explain why pulse checking is important during independent exercise;Long Term: Able to check pulse independently and accurately       Understanding of Exercise Prescription  Yes       Intervention  Provide education, explanation, and written materials on patient's individual exercise prescription       Expected Outcomes  Short Term: Able to explain program exercise prescription;Long Term: Able to explain home exercise prescription to exercise independently           Nutrition & Weight - Outcomes: Pre Biometrics - 03/31/18 1444      Pre Biometrics   Height  6' 1.4" (1.864 m)    Weight  211 lb 1.6 oz (95.8 kg)    Waist Circumference  41.5 inches    Hip Circumference  40 inches    Waist to Hip Ratio  1.04 %    BMI (Calculated)  27.56      Post Biometrics - 06/10/18 0928       Post  Biometrics   Height  6' 1.4" (1.864 m)    Weight  207 lb 11.2 oz (94.2 kg)    Waist Circumference  39 inches    Hip Circumference  40 inches    Waist to Hip Ratio  0.98 %    BMI (Calculated)  27.12    Single Leg Stand  13.34 seconds       Nutrition: Nutrition Therapy & Goals - 04/18/18 1006      Nutrition Therapy   Diet  TLC/ Heart Healthy    Protein (specify units)  9oz    Fiber  35 grams    Whole Grain Foods  3 servings    Saturated Fats  16 max. grams    Fruits and Vegetables  6 servings/day 8 ideal    Sodium  2000 grams      Personal Nutrition Goals   Nutrition Goal  Continue to practice choosing lower-sodium food and beverage options. Try not to add salt to meals after cooking, choose salt-free seasonings instead    Personal Goal #2  Practice choosing foods from different food groups when puting together a lunch meal, for example: a lean protein + 1 serving of heart healthy fat + a starch (baked potato, rice, vegetble, fruit). include dairy when possible    Personal Goal #3  Choose foods high in fiber, including fruits, vegetables, whole grains, beans and skins of foods like potatoes  Comments  He and his wife have been working towards eating a heart-healthy diet. They typically do not cook with salt and look for heart-healthy options when eating out. He does still add salt to meals on occasion      Intervention Plan   Intervention  Prescribe, educate and counsel regarding individualized specific dietary modifications aiming towards targeted core components such as weight, hypertension, lipid management, diabetes, heart failure and other  comorbidities.    Expected Outcomes  Short Term Goal: Understand basic principles of dietary content, such as calories, fat, sodium, cholesterol and nutrients.;Long Term Goal: Adherence to prescribed nutrition plan.;Short Term Goal: A plan has been developed with personal nutrition goals set during dietitian appointment.       Nutrition Discharge: Nutrition Assessments - 06/08/18 1142      MEDFICTS Scores   Pre Score  30    Post Score  26    Score Difference  -4       Education Questionnaire Score: Knowledge Questionnaire Score - 06/08/18 1142      Knowledge Questionnaire Score   Pre Score  23/28    Post Score  25/28       Goals reviewed with patient; copy given to patient.

## 2018-06-20 NOTE — Progress Notes (Signed)
Cardiac Individual Treatment Plan  Patient Details  Name: Richard Melton MRN: [id number] Date of Birth: [birth year] Referring Provider:     Cardiac Rehab from 03/31/2018 in [facility] Cardiac and Pulmonary Rehab  Referring Provider  [name]      Initial Encounter Date:    Cardiac Rehab from 03/31/2018 in [facility] Cardiac and Pulmonary Rehab  Date  03/31/18      Visit Diagnosis: Status post coronary artery stent placement  Patient's Home Medications on Admission:  Current Outpatient Medications:  .  acetaminophen (TYLENOL) 500 MG tablet, Take 500 mg by mouth every 6 (six) hours as needed for moderate pain or headache., Disp: , Rfl:  .  amLODipine (NORVASC) 2.5 MG tablet, Take 2.5 mg by mouth every evening. , Disp: , Rfl:  .  aspirin EC 81 MG tablet, Take 81 mg by mouth daily., Disp: , Rfl:  .  clopidogrel (PLAVIX) 75 MG tablet, Take 1 tablet (75 mg total) by mouth daily., Disp: 30 tablet, Rfl: 11 .  esomeprazole (NEXIUM) 40 MG capsule, Take 40 mg by mouth daily. , Disp: , Rfl:  .  fexofenadine (ALLEGRA) 180 MG tablet, Take 180 mg by mouth every evening. , Disp: , Rfl:  .  hyoscyamine (LEVSIN, ANASPAZ) 0.125 MG tablet, Take 0.125 mg by mouth every 4 (four) hours as needed for cramping. , Disp: , Rfl:  .  isosorbide mononitrate (IMDUR) 30 MG 24 hr tablet, , Disp: , Rfl: 11 .  Melatonin 3 MG TABS, Take 3 mg by mouth at bedtime. , Disp: , Rfl:  .  mirtazapine (REMERON) 15 MG tablet, Take 7.5 mg by mouth at bedtime., Disp: , Rfl:  .  mometasone (ELOCON) 0.1 % lotion, Apply 4 drops topically daily as needed (for ear itch)., Disp: , Rfl:  .  Multiple Vitamins-Minerals (CENTRUM SILVER PO), Take 1 tablet by mouth daily., Disp: , Rfl:  .  olmesartan (BENICAR) 40 MG tablet, Take 40 mg by mouth daily., Disp: , Rfl:  .  Omega-3 Fatty Acids (OMEGA 3 PO), Take 1 capsule by mouth 2 (two) times daily. , Disp: , Rfl:  .  Probiotic Product (ALIGN) 4 MG CAPS, Take 8 mg by mouth daily. , Disp: , Rfl:  .   rosuvastatin (CRESTOR) 10 MG tablet, Take 10 mg by mouth daily., Disp: , Rfl:  .  sodium chloride (OCEAN) 0.65 % SOLN nasal spray, Place 1-2 sprays into both nostrils as needed for congestion., Disp: , Rfl:   Past Medical History: Past Medical History:  Diagnosis Date  . Allergic   . Anxiety   . Cancer [facility])    Prostate  . Chronic insomnia   . Headache    Migraines  . Hyperlipidemia   . Hypertension   . Hypertriglyceridemia   . IBS (irritable bowel syndrome)   . Psoriasis   . Sleep apnea     Tobacco Use: Social History   Tobacco Use  Smoking Status Never Smoker  Smokeless Tobacco Never Used    Labs: Recent Review Flowsheet Data    There is no flowsheet data to display.       Exercise Target Goals:    Exercise Program Goal: Individual exercise prescription set using results from initial 6 min walk test and THRR while considering  patient's activity barriers and safety.   Exercise Prescription Goal: Initial exercise prescription builds to 30-45 minutes a day of aerobic activity, 2-3 days per week.  Home exercise guidelines will be given to patient during program as part  of exercise prescription that the participant will acknowledge.  Activity Barriers & Risk Stratification: Activity Barriers & Cardiac Risk Stratification - 03/31/18 1435      Activity Barriers & Cardiac Risk Stratification   Activity Barriers  Back Problems;Joint Problems    Cardiac Risk Stratification  High       6 Minute Walk: 6 Minute Walk    Row Name 03/31/18 1446 06/10/18 0927       6 Minute Walk   Phase  -  Discharge    Distance  1542 feet  2000 feet    Distance % Change  -  29.7 %    Distance Feet Change  -  458 ft    Walk Time  6 minutes  6 minutes    # of Rest Breaks  0  0    MPH  2.92  3.79    METS  3.18  4.14    RPE  11  17    Perceived Dyspnea   0  -    VO2 Peak  11.12  14.48    Symptoms  Yes (comment)  Yes (comment)    Comments  jaw pain2/10 that resolved with rest -  has spoken with Dr - not a new s/s  jaw pain2/10 that resolved with rest    Resting HR  60 bpm  58 bpm    Resting BP  138/60  140/80    Resting Oxygen Saturation   98 %  -    Exercise Oxygen Saturation  during 6 min walk  99 %  -    Max Ex. HR  96 bpm  120 bpm    Max Ex. BP  142/66  128/72    2 Minute Post BP  118/56  -       Oxygen Initial Assessment: Oxygen Initial Assessment - 03/31/18 1440      Home Oxygen   Sleep Oxygen Prescription  CPAP    Liters per minute  2       Oxygen Re-Evaluation:   Oxygen Discharge (Final Oxygen Re-Evaluation):   Initial Exercise Prescription: Initial Exercise Prescription - 03/31/18 1400      Date of Initial Exercise RX and Referring Provider   Date  03/31/18    Referring Provider  [name]      Treadmill   MPH  2.8    Grade  0    Minutes  15    METs  3.14      Arm Ergometer   Level  2    Watts  50    RPM  40    Minutes  15    METs  3      Recumbant Elliptical   Level  3    RPM  50    Minutes  15    METs  3      Prescription Details   Frequency (times per week)  3    Duration  Progress to 45 minutes of aerobic exercise without signs/symptoms of physical distress      Intensity   THRR 40-80% of Max Heartrate  94-128    Ratings of Perceived Exertion  11-15    Perceived Dyspnea  0-4      Resistance Training   Training Prescription  Yes    Weight  4 lb    Reps  10-15       Perform Capillary Blood Glucose checks as needed.  Exercise Prescription Changes: Exercise Prescription Changes    Row  Name 03/31/18 1400 04/06/18 1500 04/08/18 0800 04/20/18 1400 05/02/18 1500     Response to Exercise   Blood Pressure (Admit)  138/60  142/78  -  134/74  122/74   Blood Pressure (Exercise)  142/66  142/68  -  144/70  138/70   Blood Pressure (Exit)  118/56  124/74  -  114/60  122/74   Heart Rate (Admit)  69 bpm  59 bpm  -  70 bpm  55 bpm   Heart Rate (Exercise)  96 bpm  112 bpm  -  129 bpm  99 bpm   Heart Rate (Exit)  64 bpm   66 bpm  -  68 bpm  61 bpm   Oxygen Saturation (Admit)  99 %  -  -  -  -   Oxygen Saturation (Exercise)  98 %  -  -  -  -   Rating of Perceived Exertion (Exercise)  11  13  -  13  12   Symptoms  -  none  -  none  none   Comments  -  first full day of exercise  -  -  -   Duration  -  Progress to 45 minutes of aerobic exercise without signs/symptoms of physical distress  -  Continue with 45 min of aerobic exercise without signs/symptoms of physical distress.  Continue with 45 min of aerobic exercise without signs/symptoms of physical distress.   Intensity  -  THRR unchanged  -  THRR unchanged  THRR unchanged     Progression   Progression  -  Continue to progress workloads to maintain intensity without signs/symptoms of physical distress.  -  Continue to progress workloads to maintain intensity without signs/symptoms of physical distress.  Continue to progress workloads to maintain intensity without signs/symptoms of physical distress.   Average METs  -  2.77  -  3.02  3.12     Resistance Training   Training Prescription  -  Yes  -  Yes  Yes   Weight  -  4 lbs  -  4 lbs  4 lbs   Reps  -  10-15  -  10-15  10-15     Interval Training   Interval Training  -  No  -  No  No     Treadmill   MPH  -  2.8  -  2.8  2.8   Grade  -  0  -  1  1   Minutes  -  15  -  15  15   METs  -  3.14  -  3.53  3.53     Arm Ergometer   Level  -  1  -  1  1   Minutes  -  15  -  15  15   METs  -  2.4  -  2.5  2.7     Elliptical   Level  -  -  -  1  1   Speed  -  -  -  3.2  3.2   Minutes  -  -  -  15  15     Home Exercise Plan   Plans to continue exercise at  -  -  [employer] (comment) [employer] and walking  [employer] (comment) [place] and walking  [employer] (comment) [employer] and walking   Frequency  -  -  Add 2 additional days to  program exercise sessions.  Add 2 additional days to program exercise sessions.  Add 2 additional days to program exercise sessions.    Initial Home Exercises Provided  -  -  04/08/18  04/08/18  04/08/18   Row Name 05/31/18 1600 06/14/18 1400           Response to Exercise   Blood Pressure (Admit)  126/68  142/78      Blood Pressure (Exercise)  144/72  162/80      Blood Pressure (Exit)  116/56  124/62      Heart Rate (Admit)  58 bpm  60 bpm      Heart Rate (Exercise)  100 bpm  100 bpm      Heart Rate (Exit)  40 bpm  59 bpm      Rating of Perceived Exertion (Exercise)  12  12      Symptoms  none  none      Duration  Continue with 45 min of aerobic exercise without signs/symptoms of physical distress.  Continue with 30 min of aerobic exercise without signs/symptoms of physical distress.      Intensity  THRR unchanged  THRR unchanged        Progression   Progression  Continue to progress workloads to maintain intensity without signs/symptoms of physical distress.  Continue to progress workloads to maintain intensity without signs/symptoms of physical distress.      Average METs  3.65  3.16        Resistance Training   Training Prescription  Yes  Yes      Weight  4 lbs  4 lbs      Reps  10-15  10-15        Interval Training   Interval Training  No  No        Treadmill   MPH  2.8  3      Grade  1  1      Minutes  15  15      METs  3.53  3.71        Arm Ergometer   Level  1  1      Watts  50  -      Minutes  15  15      METs  3.76  2.6        Elliptical   Level  1  1      Speed  3.2  3.2      Minutes  15  15        Home Exercise Plan   Plans to continue exercise at  [employer] (comment) [place] and walking  [employer] (comment) [place] and walking      Frequency  Add 2 additional days to program exercise sessions.  Add 2 additional days to program exercise sessions.      Initial Home Exercises Provided  04/08/18  04/08/18         Exercise Comments: Exercise Comments    Row Name 04/04/18 0800 04/04/18 0945 06/20/18 0806       Exercise Comments  :First full day of  exercise!  Patient was oriented to gym and equipment including functions, settings, policies, and procedures.  Patient's individual exercise prescription and treatment plan were reviewed.  All starting workloads were established based on the results of the 6 minute walk test done at initial orientation visit.  The plan for exercise progression was also introduced and progression will be  customized based on patient's performance and goals.  Reshawn reported jaw pain on the elliptical today.  It is the same jaw pain that he was having prior to his stents. He has continued to have jaw pain since his stents, thus is on Imdur.  Today it got to a 3/10.  We talked about making sure he lets the staff know about it and not to exceed a 6/10 before taking a rest break.   Geoffry graduated today from  rehab with 36 sessions completed.  Details of the patient's exercise prescription and what He needs to do in order to continue the prescription and progress were discussed with patient.  Patient was given a copy of prescription and goals.  Patient verbalized understanding.  Tahjir plans to continue to exercise by walking at home.        Exercise Goals and Review: Exercise Goals    Row Name 03/31/18 1445             Exercise Goals   Increase Physical Activity  Yes       Intervention  Provide advice, education, support and counseling about physical activity/exercise needs.;Develop an individualized exercise prescription for aerobic and resistive training based on initial evaluation findings, risk stratification, comorbidities and participant's personal goals.       Expected Outcomes  Short Term: Attend rehab on a regular basis to increase amount of physical activity.;Long Term: Add in home exercise to make exercise part of routine and to increase amount of physical activity.;Long Term: Exercising regularly at least 3-5 days a week.       Increase Strength and Stamina  Yes       Intervention  Provide advice, education,  support and counseling about physical activity/exercise needs.;Develop an individualized exercise prescription for aerobic and resistive training based on initial evaluation findings, risk stratification, comorbidities and participant's personal goals.       Expected Outcomes  Short Term: Increase workloads from initial exercise prescription for resistance, speed, and METs.;Short Term: Perform resistance training exercises routinely during rehab and add in resistance training at home;Long Term: Improve cardiorespiratory fitness, muscular endurance and strength as measured by increased METs and functional capacity (6MWT)       Able to understand and use rate of perceived exertion (RPE) scale  Yes       Intervention  Provide education and explanation on how to use RPE scale       Expected Outcomes  Short Term: Able to use RPE daily in rehab to express subjective intensity level;Long Term:  Able to use RPE to guide intensity level when exercising independently       Able to understand and use Dyspnea scale  Yes       Intervention  Provide education and explanation on how to use Dyspnea scale       Expected Outcomes  Short Term: Able to use Dyspnea scale daily in rehab to express subjective sense of shortness of breath during exertion;Long Term: Able to use Dyspnea scale to guide intensity level when exercising independently       Knowledge and understanding of Target Heart Rate Range (THRR)  Yes       Intervention  Provide education and explanation of THRR including how the numbers were predicted and where they are located for reference       Expected Outcomes  Short Term: Able to state/look up THRR;Short Term: Able to use daily as guideline for intensity in rehab;Long Term: Able to use THRR to govern  intensity when exercising independently       Able to check pulse independently  Yes       Intervention  Provide education and demonstration on how to check pulse in carotid and radial arteries.;Review the  importance of being able to check your own pulse for safety during independent exercise       Expected Outcomes  Short Term: Able to explain why pulse checking is important during independent exercise;Long Term: Able to check pulse independently and accurately       Understanding of Exercise Prescription  Yes       Intervention  Provide education, explanation, and written materials on patient's individual exercise prescription       Expected Outcomes  Short Term: Able to explain program exercise prescription;Long Term: Able to explain home exercise prescription to exercise independently          Exercise Goals Re-Evaluation : Exercise Goals Re-Evaluation    Row Name 04/04/18 0801 04/08/18 0857 04/20/18 1450 04/22/18 1038 05/02/18 1510     Exercise Goal Re-Evaluation   Exercise Goals Review  Understanding of Exercise Prescription;Knowledge and understanding of Target Heart Rate Range (THRR);Able to understand and use rate of perceived exertion (RPE) scale;Increase Strength and Stamina;Increase Physical Activity  Increase Physical Activity;Able to understand and use rate of perceived exertion (RPE) scale;Knowledge and understanding of Target Heart Rate Range (THRR);Understanding of Exercise Prescription;Able to check pulse independently;Increase Strength and Stamina  Increase Physical Activity;Understanding of Exercise Prescription;Increase Strength and Stamina  Increase Physical Activity;Understanding of Exercise Prescription;Increase Strength and Stamina  Increase Physical Activity;Understanding of Exercise Prescription;Increase Strength and Stamina   Comments  Reviewed RPE scale, THR and program prescription with pt today.  Pt voiced understanding and was given a copy of goals to take home.   Reviewed home exercise with pt today.  Pt plans to walk and go to gym at [facility] for exercise.  Reviewed THR, pulse, RPE, sign and symptoms, and when to call 911 or MD.  Also discussed weather considerations  and indoor options.  Pt voiced understanding.  Colleen has been doing well in rehab.  He has taken to the elliptical and is now getting the full 15 min on there.  We will start to increase his workloads more.  We will continue to monitor his progression.   Burnett continues to do well in rehab.  He feels that it is going good so far.  He has been walking at home for 30 min 2-3 days a week.  He has also been able to get back into his pickleball games on Tuesdays and Thursdays.  He can tell that his stamina is improving as he is no longer getting as winded, still get tired but able to play for two hours versus just one hour.  He is please with his progress so far.  We will continue to work with him on building his strength and stamina.   Clem has been doing well in rehab.  He will be out the rest of the month with his trip to [place].  He plans to do some walking while he is there and will return to finish the program in June.     Expected Outcomes  Short: Use RPE daily to regulate intensity.  Long: Follow program prescription in THR.  Short: Start to add in at least two extra days a week at the gym starting in two weeks.  Long: Continue to exercise indep  Short: Increase workload on arm crank and treadmill.  Long: Continue to add in exercise at gym on off days.   Short: Increase workload on arm crank and treadmill.  Long: Continue to add in exercise at gym and walking at home on off days.   Short: Enjoy trip.  Long: Return to rehab to finish.    [place] Name 05/31/18 1604 06/03/18 0803 06/14/18 1425         Exercise Goal Re-Evaluation   Exercise Goals Review  Increase Physical Activity;Understanding of Exercise Prescription;Increase Strength and Stamina  Increase Physical Activity;Understanding of Exercise Prescription;Increase Strength and Stamina  Increase Physical Activity;Understanding of Exercise Prescription;Increase Strength and Stamina     Comments  Nazire has returned from his trip to [place] and was able  to pick up from where he left off. He was getting 50 watts on the arm crank.  We will continue to monitor his progression.   Jessey is doing good in exercise.  He had some jaw tightness on treadmill today, but it relieved once he got off.  He has been walking on his off days.  He will aim to get at least two days a week for 78mn.  DLeulcontinues to do well in rehab.  He is already nearing graduation!  He improved his post 6MWT by 458 ft!!  He is planning to continue to walk on his own after graduation.      Expected Outcomes  Short: Continue to attend rehab regularly again.  Long: Continue to exercise on his off days.   Short: Continue to attend rehab regularly again.  Long: Continue to exercise on his off days.   Short: Graduate!!  Long: Continue to exercise independently.         Discharge Exercise Prescription (Final Exercise Prescription Changes): Exercise Prescription Changes - 06/14/18 1400      Response to Exercise   Blood Pressure (Admit)  142/78    Blood Pressure (Exercise)  162/80    Blood Pressure (Exit)  124/62    Heart Rate (Admit)  60 bpm    Heart Rate (Exercise)  100 bpm    Heart Rate (Exit)  59 bpm    Rating of Perceived Exertion (Exercise)  12    Symptoms  none    Duration  Continue with 30 min of aerobic exercise without signs/symptoms of physical distress.    Intensity  THRR unchanged      Progression   Progression  Continue to progress workloads to maintain intensity without signs/symptoms of physical distress.    Average METs  3.16      Resistance Training   Training Prescription  Yes    Weight  4 lbs    Reps  10-15      Interval Training   Interval Training  No      Treadmill   MPH  3    Grade  1    Minutes  15    METs  3.71      Arm Ergometer   Level  1    Minutes  15    METs  2.6      Elliptical   Level  1    Speed  3.2    Minutes  15      Home Exercise Plan   Plans to continue exercise at  [employer] [place]  [place]    Frequency  Add 2 additional days to program exercise sessions.    Initial Home Exercises Provided  04/08/18  Nutrition:  Target Goals: Understanding of nutrition guidelines, daily intake of sodium <1567m, cholesterol <2043m calories 30% from fat and 7% or less from saturated fats, daily to have 5 or more servings of fruits and vegetables.  Biometrics: Pre Biometrics - 03/31/18 1444      Pre Biometrics   Height  6' 1.4" (1.864 m)    Weight  211 lb 1.6 oz (95.8 kg)    Waist Circumference  41.5 inches    Hip Circumference  40 inches    Waist to Hip Ratio  1.04 %    BMI (Calculated)  27.56      Post Biometrics - 06/10/18 0928       Post  Biometrics   Height  6' 1.4" (1.864 m)    Weight  207 lb 11.2 oz (94.2 kg)    Waist Circumference  39 inches    Hip Circumference  40 inches    Waist to Hip Ratio  0.98 %    BMI (Calculated)  27.12    Single Leg Stand  13.34 seconds       Nutrition Therapy Plan and Nutrition Goals: Nutrition Therapy & Goals - 04/18/18 1006      Nutrition Therapy   Diet  TLC/ Heart Healthy    Protein (specify units)  9oz    Fiber  35 grams    Whole Grain Foods  3 servings    Saturated Fats  16 max. grams    Fruits and Vegetables  6 servings/day 8 ideal    Sodium  2000 grams      Personal Nutrition Goals   Nutrition Goal  Continue to practice choosing lower-sodium food and beverage options. Try not to add salt to meals after cooking, choose salt-free seasonings instead    Personal Goal #2  Practice choosing foods from different food groups when puting together a lunch meal, for example: a lean protein + 1 serving of heart healthy fat + a starch (baked potato, rice, vegetble, fruit). include dairy when possible    Personal Goal #3  Choose foods high in fiber, including fruits, vegetables, whole grains, beans and skins of foods like potatoes    Comments  He and his wife have been working towards eating a heart-healthy diet. They typically  do not cook with salt and look for heart-healthy options when eating out. He does still add salt to meals on occasion      Intervention Plan   Intervention  Prescribe, educate and counsel regarding individualized specific dietary modifications aiming towards targeted core components such as weight, hypertension, lipid management, diabetes, heart failure and other comorbidities.    Expected Outcomes  Short Term Goal: Understand basic principles of dietary content, such as calories, fat, sodium, cholesterol and nutrients.;Long Term Goal: Adherence to prescribed nutrition plan.;Short Term Goal: A plan has been developed with personal nutrition goals set during dietitian appointment.       Nutrition Assessments: Nutrition Assessments - 06/08/18 1142      MEDFICTS Scores   Pre Score  30    Post Score  26    Score Difference  -4       Nutrition Goals Re-Evaluation: Nutrition Goals Re-Evaluation    Row Name 04/18/18 1011 04/22/18 1045 06/03/18 0807         Goals   Nutrition Goal  Practice choosing foods from different food groups when puting together a lunch meal, for example: a lean protein + 1 serving of heart healthy fat + a starch (baked  potato, rice, vegetble, fruit). include dairy when possible  Practice choosing foods from different food groups when puting together a lunch meal, for example: a lean protein + 1 serving of heart healthy fat + a starch (baked potato, rice, vegetble, fruit). include dairy when possible; Higher fiber, Lower sodium  Practice choosing foods from different food groups when puting together a lunch meal, for example: a lean protein + 1 serving of heart healthy fat + a starch (baked potato, rice, vegetble, fruit). include dairy when possible; Higher fiber, Lower sodium     Comment  He is in the process of making his lunch meal larger than dinner to help manage his IBS but is struggling to find options and variety  Jusiah enjoyed meeting with Lattie Haw earlier this week.   He said that he learned a lot from her and will start to use her suggestions in finding more fiber and lower sodium options.   Ebin continues to do well with his diet.  He continues to lose weight.  He continues to focus on more fiber and lower sodium.       Expected Outcome  He will choose lunch options that are low in sodium and that contain foods from various food groups with a heart-healthy diet in mind  Short: He will choose lunch options that are low in sodium and that contain foods from various food groups with a heart-healthy diet in mind  Long: Continue to follow recommendations  Short: Continue to to aim for low sodium and high fiber.  Long: Continue to follow heart healthy diet.        Personal Goal #2 Re-Evaluation   Personal Goal #2  Choose foods high in fiber including fruits, vegetables, whole grains, beans and skins of foods like potatoes  -  -       Personal Goal #3 Re-Evaluation   Personal Goal #3  Continue to practice choosing lower-sodim food and beverage options. Try not to add salt to meals after cooking, choose salt-free seasonings instead  -  -        Nutrition Goals Discharge (Final Nutrition Goals Re-Evaluation): Nutrition Goals Re-Evaluation - 06/03/18 0807      Goals   Nutrition Goal  Practice choosing foods from different food groups when puting together a lunch meal, for example: a lean protein + 1 serving of heart healthy fat + a starch (baked potato, rice, vegetble, fruit). include dairy when possible; Higher fiber, Lower sodium    Comment  Nijah continues to do well with his diet.  He continues to lose weight.  He continues to focus on more fiber and lower sodium.      Expected Outcome  Short: Continue to to aim for low sodium and high fiber.  Long: Continue to follow heart healthy diet.        Psychosocial: Target Goals: Acknowledge presence or absence of significant depression and/or stress, maximize coping skills, provide positive support system.  Participant is able to verbalize types and ability to use techniques and skills needed for reducing stress and depression.   Initial Review & Psychosocial Screening: Initial Psych Review & Screening - 03/31/18 1431      Initial Review   Current issues with  Current Anxiety/Panic;Current Sleep Concerns;Current Stress Concerns    Source of Stress Concerns  Chronic Illness;Unable to participate in former interests or hobbies    Comments  He had prostate CA in 2008 and still stresses about a relapse of cancer of some type. States  they have undergone some stress from a move 2 years ago from [place] area to [facility] and still are struggling to find their place here. He also struggles some socially and has stress since his event being able to be as active as he once was.       Family Dynamics   Good Support System?  Yes    Comments  wife and [place] community      Barriers   Psychosocial barriers to participate in program  The patient should benefit from training in stress management and relaxation.      Screening Interventions   Interventions  Encouraged to exercise;Program counselor consult    Expected Outcomes  Short Term goal: Utilizing psychosocial counselor, staff and physician to assist with identification of specific Stressors or current issues interfering with healing process. Setting desired goal for each stressor or current issue identified.;Long Term Goal: Stressors or current issues are controlled or eliminated.;Short Term goal: Identification and review with participant of any Quality of Life or Depression concerns found by scoring the questionnaire.;Long Term goal: The participant improves quality of Life and PHQ9 Scores as seen by post scores and/or verbalization of changes       Quality of Life Scores:  Quality of Life - 06/08/18 1142      Quality of Life Scores   Health/Function Pre  19.92 %    Health/Function Post  25.5 %    Health/Function % Change  28.01 %    Socioeconomic Pre  25  %    Socioeconomic Post  28.43 %    Socioeconomic % Change   13.72 %    Psych/Spiritual Pre  20.43 %    Psych/Spiritual Post  27.07 %    Psych/Spiritual % Change  32.5 %    Family Pre  24 %    Family Post  25.25 %    Family % Change  5.21 %    GLOBAL Pre  21.71 %    GLOBAL Post  26.42 %    GLOBAL % Change  21.7 %      Scores of 19 and below usually indicate a poorer quality of life in these areas.  A difference of  2-3 points is a clinically meaningful difference.  A difference of 2-3 points in the total score of the Quality of Life Index has been associated with significant improvement in overall quality of life, self-image, physical symptoms, and general health in studies assessing change in quality of life.  PHQ-9: Recent Review Flowsheet Data    Depression screen [facility] 2/9 06/08/2018 03/31/2018   Decreased Interest 0 0   Down, Depressed, Hopeless 0 0   PHQ - 2 Score 0 0   Altered sleeping 0 0   Tired, decreased energy 1 1   Change in appetite 0 2   Feeling bad or failure about yourself  0 1   Trouble concentrating 1 0   Moving slowly or fidgety/restless 0 0   Suicidal thoughts 0 0   PHQ-9 Score 2 4   Difficult doing work/chores Not difficult at all Not difficult at all     Interpretation of Total Score  Total Score Depression Severity:  1-4 = Minimal depression, 5-9 = Mild depression, 10-14 = Moderate depression, 15-19 = Moderately severe depression, 20-27 = Severe depression   Psychosocial Evaluation and Intervention: Psychosocial Evaluation - 04/04/18 0919      Psychosocial Evaluation & Interventions   Interventions  Encouraged to exercise with the program and follow  exercise prescription    Comments  Counselor met with Mr. Tinkham Hollibaugh) today for initial psychosocial evaluation.  He is a 77 year old who had several stents inserted due to blockages on 3/26.  He has a family history of heart disease.  Basir has a strong support system with a spouse of 52 years; lives in  a retirement community and is actively involved in his [employer].  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  Saahir reports sleeping well most of the time with the use of a CPAP and a medication he takes at bedtime to help with anxiety and sleep.  He states his appetite is "too good" and he has goals to lose some weight while in this program.   Harland has some anxiety but no depression and is able to manage with exercise and coping strategies and the sleep aid at night.  He has minimal stress in his life other than his health.  Murry is accustomed to walking 30 minutes each day for years and has goals to be educated on his exercise limits while in this program.  He is planning a trip with his spouse to [place] mid-May and wants to be ready for this.  Vikrant will be followed by staff throughout the course of this program.     Expected Outcomes  Short - [place] will meet with the dietician to address his weight loss goal.  Long - Akili will exercise consistently and attend the educational components to recognize his exercise limits and increase his stamina.      Continue Psychosocial Services   Follow up required by staff       Psychosocial Re-Evaluation: Psychosocial Re-Evaluation    [place] Name 04/22/18 1047 06/03/18 0809 06/15/18 1024         Psychosocial Re-Evaluation   Current issues with  Current Stress Concerns  Current Stress Concerns  None Identified     Comments  Namon is looking forward to his [place] trip!!  He will be gone for three weeks and hopes not to lose everything he has already gained.  He is doing well mentally and continues to try not to dwell on everything that has happened so far.  He was just shocked about how everything had happened to him, even though now looking back he should have been on the lookout given his strong family history.  He continues to sleep well with  all of his medications and found that couting backwards really helps ease him to sleep.   Shiva enjoyed his trip to [place]! And then just celebrated a family birthday in [place] last week.  He continues to do well mentatlly and staying postive.  He continues to sleep well and stays compliant with his CPAP.  He did not sleep on plane during their trip, they were flying for over 24 hours. He has enjoyed getting into the routine of exercise again and knowing that he is doing well.   Counselor follow up with Wells today reporting he has recovered from his amazing [place] vacation with his spouse.  He is sleeping better now and reports it is the first time he has "lost weight" on a vacation.  Django is making progress on his goals of losing weight; knowing and understanding his exercise limits; and being ready for his recent trip mentally and physically.  Counselor commended Mont[place] on the progress he has  made and his commitment to exercise for positive self-care.       Expected Outcomes  Short: Enjoy his [place] trip!!  Long: Continue to cope with his heart disease  postively.   Short: Continue to stay postive.  Long: Continue to stay compliant with CPAP and sleep well.   Short:  Parley will continue to eat healthier and stay active for weight loss.   Long:  Kramer will exercise to maintain positive self-care.     Interventions  Stress management education;Encouraged to attend Cardiac Rehabilitation for the exercise  Encouraged to attend Cardiac Rehabilitation for the exercise  Encouraged to attend Cardiac Rehabilitation for the exercise     Continue Psychosocial Services   Follow up required by staff  Follow up required by staff  Follow up required by staff     Comments  He had prostate CA in 2008 and still stresses about a relapse of cancer of some type. States they have undergone some stress from a move 2 years ago from [place] area to [facility] and still are struggling to find their place here. He also struggles some socially and  has stress since his event being able to be as active as he once was.   -  -       Initial Review   Source of Stress Concerns  Chronic Illness;Unable to participate in former interests or hobbies  -  -        Psychosocial Discharge (Final Psychosocial Re-Evaluation): Psychosocial Re-Evaluation - 06/15/18 1024      Psychosocial Re-Evaluation   Current issues with  None Identified    Comments  Counselor follow up with Simona Huh today reporting he has recovered from his amazing [place] vacation with his spouse.  He is sleeping better now and reports it is the first time he has "lost weight" on a vacation.  Rondo is making progress on his goals of losing weight; knowing and understanding his exercise limits; and being ready for his recent trip mentally and physically.  Counselor commended Vinton on the progress he has made and his commitment to exercise for positive self-care.      Expected Outcomes  Short:  Govanni will continue to eat healthier and stay active for weight loss.   Long:  Caster will exercise to maintain positive self-care.    Interventions  Encouraged to attend Cardiac Rehabilitation for the exercise    Continue Psychosocial Services   Follow up required by staff       Vocational Rehabilitation: Provide vocational rehab assistance to qualifying candidates.   Vocational Rehab Evaluation & Intervention: Vocational Rehab - 03/31/18 1439      Initial Vocational Rehab Evaluation & Intervention   Assessment shows need for Vocational Rehabilitation  No       Education: Education Goals: Education classes will be provided on a variety of topics geared toward better understanding of heart health and risk factor modification. Participant will state understanding/return demonstration of topics presented as noted by education test scores.  Learning Barriers/Preferences: Learning Barriers/Preferences - 03/31/18 1437      Learning Barriers/Preferences   Learning Barriers  Sight;Hearing  wears glasses and a hearing aid    Learning Preferences  Individual Instruction;Verbal Instruction;Skilled Demonstration       Education Topics:  AED/CPR: - Group verbal and written instruction with the use of models to demonstrate the basic use of the AED with the basic ABC's of resuscitation.   General Nutrition Guidelines/Fats and Fiber: -Group instruction provided by verbal, written  material, models and posters to present the general guidelines for heart healthy nutrition. Gives an explanation and review of dietary fats and fiber.   Controlling Sodium/Reading Food Labels: -Group verbal and written material supporting the discussion of sodium use in heart healthy nutrition. Review and explanation with models, verbal and written materials for utilization of the food label.   Exercise Physiology & General Exercise Guidelines: - Group verbal and written instruction with models to review the exercise physiology of the cardiovascular system and associated critical values. Provides general exercise guidelines with specific guidelines to those with heart or lung disease.    Cardiac Rehab from 06/20/2018 in [facility] Cardiac and Pulmonary Rehab  Date  04/04/18  Educator  [facility]  Instruction Review Code  1- Verbalizes Understanding      Aerobic Exercise & Resistance Training: - Gives group verbal and written instruction on the various components of exercise. Focuses on aerobic and resistive training programs and the benefits of this training and how to safely progress through these programs..   Cardiac Rehab from 06/20/2018 in [facility] Cardiac and Pulmonary Rehab  Date  06/06/18  Educator  [facility]  Instruction Review Code  1- Geologist, engineering, Balance, Mind/Body Relaxation: Provides group verbal/written instruction on the benefits of flexibility and balance training, including mind/body exercise modes such as yoga, pilates and tai chi.  Demonstration and skill practice provided.    Cardiac Rehab from 06/20/2018 in [facility] Cardiac and Pulmonary Rehab  Date  06/08/18  Educator  [facility]  Instruction Review Code  1- Verbalizes Understanding      Stress and Anxiety: - Provides group verbal and written instruction about the health risks of elevated stress and causes of high stress.  Discuss the correlation between heart/lung disease and anxiety and treatment options. Review healthy ways to manage with stress and anxiety.   Cardiac Rehab from 06/20/2018 in [facility] Cardiac and Pulmonary Rehab  Date  04/20/18  Educator  [facility]  Instruction Review Code  1- Verbalizes Understanding      Depression: - Provides group verbal and written instruction on the correlation between heart/lung disease and depressed mood, treatment options, and the stigmas associated with seeking treatment.   Cardiac Rehab from 06/20/2018 in [facility] Cardiac and Pulmonary Rehab  Date  04/06/18  Educator  [facility]  Instruction Review Code  1- Verbalizes Understanding      Anatomy & Physiology of the Heart: - Group verbal and written instruction and models provide basic cardiac anatomy and physiology, with the coronary electrical and arterial systems. Review of Valvular disease and Heart Failure   Cardiac Rehab from 06/20/2018 in [facility] Cardiac and Pulmonary Rehab  Date  04/18/18  Educator  [facility]  Instruction Review Code  1- Verbalizes Understanding      Cardiac Procedures: - Group verbal and written instruction to review commonly prescribed medications for heart disease. Reviews the medication, class of the drug, and side effects. Includes the steps to properly store meds and maintain the prescription regimen. (beta blockers and nitrates)   Cardiac Rehab from 06/20/2018 in [facility] Cardiac and Pulmonary Rehab  Date  06/20/18  Educator  SB  Instruction Review Code  1- Verbalizes Understanding      Cardiac Medications I: - Group verbal and written instruction to review commonly prescribed medications for heart disease. Reviews the  medication, class of the drug, and side effects. Includes the steps to properly store meds and maintain the prescription regimen.   Cardiac Rehab from 06/20/2018 in [facility]  Cardiac and Pulmonary Rehab  Date  06/13/18  Educator  CE  Instruction Review Code  1- Verbalizes Understanding      Cardiac Medications II: -Group verbal and written instruction to review commonly prescribed medications for heart disease. Reviews the medication, class of the drug, and side effects. (all other drug classes)   Cardiac Rehab from 06/20/2018 in [facility] Cardiac and Pulmonary Rehab  Date  05/25/18  Educator  SB  Instruction Review Code  1- Verbalizes Understanding       Go Sex-Intimacy & Heart Disease, Get SMART - Goal Setting: - Group verbal and written instruction through game format to discuss heart disease and the return to sexual intimacy. Provides group verbal and written material to discuss and apply goal setting through the application of the S.M.A.R.T. Method.   Cardiac Rehab from 06/20/2018 in [facility] Cardiac and Pulmonary Rehab  Date  06/20/18  Educator  SB  Instruction Review Code  1- Verbalizes Understanding      Other Matters of the Heart: - Provides group verbal, written materials and models to describe Stable Angina and Peripheral Artery. Includes description of the disease process and treatment options available to the cardiac patient.   Cardiac Rehab from 06/20/2018 in [facility] Cardiac and Pulmonary Rehab  Date  04/18/18  Educator  SB  Instruction Review Code  1- Verbalizes Understanding      Exercise & Equipment Safety: - Individual verbal instruction and demonstration of equipment use and safety with use of the equipment.   Cardiac Rehab from 06/20/2018 in [facility] Cardiac and Pulmonary Rehab  Date  03/31/18  Educator  [facility]  Instruction Review Code  1- Verbalizes Understanding      Infection Prevention: - Provides verbal and written material to individual with discussion of infection control  including proper hand washing and proper equipment cleaning during exercise session.   Cardiac Rehab from 06/20/2018 in [facility] Cardiac and Pulmonary Rehab  Date  03/31/18  Educator  [facility]  Instruction Review Code  1- Verbalizes Understanding      Falls Prevention: - Provides verbal and written material to individual with discussion of falls prevention and safety.   Cardiac Rehab from 06/20/2018 in [facility] Cardiac and Pulmonary Rehab  Date  03/31/18  Educator  [facility]  Instruction Review Code  1- Verbalizes Understanding      Diabetes: - Individual verbal and written instruction to review signs/symptoms of diabetes, desired ranges of glucose level fasting, after meals and with exercise. Acknowledge that pre and post exercise glucose checks will be done for 3 sessions at entry of program.   Know Your Numbers and Risk Factors: -Group verbal and written instruction about important numbers in your health.  Discussion of what are risk factors and how they play a role in the disease process.  Review of Cholesterol, Blood Pressure, Diabetes, and BMI and the role they play in your overall health.   Cardiac Rehab from 06/20/2018 in [facility] Cardiac and Pulmonary Rehab  Date  05/25/18  Educator  SB  Instruction Review Code  1- Verbalizes Understanding      Sleep Hygiene: -Provides group verbal and written instruction about how sleep can affect your health.  Define sleep hygiene, discuss sleep cycles and impact of sleep habits. Review good sleep hygiene tips.    Other: -Provides group and verbal instruction on various topics (see comments)   Knowledge Questionnaire Score: Knowledge Questionnaire Score - 06/08/18 1142      Knowledge Questionnaire Score   Pre Score  23/28  Post Score  25/28       Core Components/Risk Factors/Patient Goals at Admission: Personal Goals and Risk Factors at Admission - 03/31/18 1428      Core Components/Risk Factors/Patient Goals on Admission    Weight Management   Yes;Weight Loss    Intervention  Weight Management: Develop a combined nutrition and exercise program designed to reach desired caloric intake, while maintaining appropriate intake of nutrient and fiber, sodium and fats, and appropriate energy expenditure required for the weight goal.;Weight Management: Provide education and appropriate resources to help participant work on and attain dietary goals.;Weight Management/Obesity: Establish reasonable short term and long term weight goals.    Admit Weight  211 lb (95.7 kg)    Goal Weight: Short Term  205 lb (93 kg)    Goal Weight: Long Term  199 lb (90.3 kg)    Expected Outcomes  Short Term: Continue to assess and modify interventions until short term weight is achieved;Long Term: Adherence to nutrition and physical activity/exercise program aimed toward attainment of established weight goal;Weight Maintenance: Understanding of the daily nutrition guidelines, which includes 25-35% calories from fat, 7% or less cal from saturated fats, less than 259m cholesterol, less than 1.5gm of sodium, & 5 or more servings of fruits and vegetables daily;Weight Loss: Understanding of general recommendations for a balanced deficit meal plan, which promotes 1-2 lb weight loss per week and includes a negative energy balance of [phone] kcal/d;Understanding of distribution of calorie intake throughout the day with the consumption of 4-5 meals/snacks;Understanding recommendations for meals to include 15-35% energy as protein, 25-35% energy from fat, 35-60% energy from carbohydrates, less than 2058mof dietary cholesterol, 20-35 gm of total fiber daily    Hypertension  Yes    Intervention  Provide education on lifestyle modifcations including regular physical activity/exercise, weight management, moderate sodium restriction and increased consumption of fresh fruit, vegetables, and low fat dairy, alcohol moderation, and smoking cessation.;Monitor prescription use compliance.     Expected Outcomes  Short Term: Continued assessment and intervention until BP is < 140/908mG in hypertensive participants. < 130/49m28m in hypertensive participants with diabetes, heart failure or chronic kidney disease.;Long Term: Maintenance of blood pressure at goal levels.    Lipids  Yes elevated triglycerides    Intervention  Provide education and support for participant on nutrition & aerobic/resistive exercise along with prescribed medications to achieve LDL <70mg90mL >40mg.63mExpected Outcomes  Short Term: Participant states understanding of desired cholesterol values and is compliant with medications prescribed. Participant is following exercise prescription and nutrition guidelines.;Long Term: Cholesterol controlled with medications as prescribed, with individualized exercise RX and with personalized nutrition plan. Value goals: LDL < 70mg, 81m> 40 mg.    Stress  Yes    Intervention  Offer individual and/or small group education and counseling on adjustment to heart disease, stress management and health-related lifestyle change. Teach and support self-help strategies.;Refer participants experiencing significant psychosocial distress to appropriate mental health specialists for further evaluation and treatment. When possible, include family members and significant others in education/counseling sessions.    Expected Outcomes  Short Term: Participant demonstrates changes in health-related behavior, relaxation and other stress management skills, ability to obtain effective social support, and compliance with psychotropic medications if prescribed.;Long Term: Emotional wellbeing is indicated by absence of clinically significant psychosocial distress or social isolation.    Personal Goal Other  Yes    Personal Goal  Return to playing pickle ball and bicycling.    Intervention  Will attent class work to improve stamina and endurance.     Expected Outcomes  He will be able to have enough stamina  and endurance to return to playing pickle ball and go longer distances on his bike.        Core Components/Risk Factors/Patient Goals Review:  Goals and Risk Factor Review    Row Name 04/22/18 1041 06/03/18 0805           Core Components/Risk Factors/Patient Goals Review   Personal Goals Review  Weight Management/Obesity;Hypertension;Lipids  Weight Management/Obesity;Hypertension;Lipids      Review  Romeo has only lost about 1 or 2 lbs since starting the program.  He would like to lose more, but is just now starting to get back his strength and stamina which will help him exercise more to help lose more weight.  He has been eating better as well.  His blood pressures have been doing well here in class.  He has not been checking it at home, but he does have a cuff and will start checking it more frequently at home.  He says that he feels his new medicaiton for his blood pressure seems to be working very well for him.  He is also trying to work with his IBS and so far has been able to remain in control of the symptoms.   Angell has continued to lose weight.  He was even able to lose weight while on vacations.  He is not checking his blood pressures at home, but will start.  He is doing well on his medications.  His Imdur still causes headaches, he is going to talk to his doctor about maybe  changing to Ranexa.       Expected Outcomes  Short: Starting using cuff at home to monitor blood pressure.  Long: Continue to work on weight loss.   Short: Start checking blood pressures.  Long: Talk to doctor about changing to Ranexa         Core Components/Risk Factors/Patient Goals at Discharge (Final Review):  Goals and Risk Factor Review - 06/03/18 0805      Core Components/Risk Factors/Patient Goals Review   Personal Goals Review  Weight Management/Obesity;Hypertension;Lipids    Review  Wrangler has continued to lose weight.  He was even able to lose weight while on vacations.  He is not checking his  blood pressures at home, but will start.  He is doing well on his medications.  His Imdur still causes headaches, he is going to talk to his doctor about maybe  changing to Ranexa.     Expected Outcomes  Short: Start checking blood pressures.  Long: Talk to doctor about changing to Ranexa       ITP Comments: ITP Comments    Row Name 03/31/18 1424 04/04/18 0944 04/13/18 0611 04/22/18 1037 05/11/18 0545   ITP Comments  Medical Review Completed; initial ITP created. Diagnosis Documentation can be found in [place] encounter dated 03/23/2018.  Carlis reported jaw pain on the elliptical today.  It is the same jaw pain that he was having prior to his stents. He has continued to have jaw pain since his stents, thus is on Imdur.  Today it got to a 3/10.  We talked about making sure he lets the staff know about it and not to exceed a 6/10 before taking a rest break.   30 day review. Continue with ITP unless directed changes per Medical Director  Preet will be leaving next week to  go on a trip to [place] for three weeks.  He will be out from 5/9-5/31 and will return in June.   30 day review. Continue with ITP unless directed changes per Medical Director   Happy Name 06/08/18 0547 06/20/18 0806         ITP Comments  30 day review. Continue with ITP unless directed changes per Medical Director review  Discharge ITP sent and signed by Dr. Sabra Heck.  Discharge Summary routed to PCP and cardiologist.         Comments: Discharge ITP

## 2019-09-04 ENCOUNTER — Other Ambulatory Visit: Payer: Self-pay

## 2019-09-04 ENCOUNTER — Other Ambulatory Visit
Admission: RE | Admit: 2019-09-04 | Discharge: 2019-09-04 | Disposition: A | Discharge status: Home / Self Care | Payer: Medicare Other | Source: Ambulatory Visit | Attending: Gastroenterology | Admitting: Gastroenterology

## 2019-09-04 DIAGNOSIS — Z01812 Encounter for preprocedural laboratory examination: Secondary | ICD-10-CM | POA: Diagnosis present

## 2019-09-04 DIAGNOSIS — Z20828 Contact with and (suspected) exposure to other viral communicable diseases: Secondary | ICD-10-CM | POA: Insufficient documentation

## 2019-09-05 LAB — SARS CORONAVIRUS 2 (TAT 6-24 HRS): SARS Coronavirus 2: NEGATIVE

## 2019-09-07 ENCOUNTER — Ambulatory Visit: Payer: Medicare Other | Admitting: Anesthesiology

## 2019-09-07 ENCOUNTER — Ambulatory Visit
Admission: RE | Admit: 2019-09-07 | Discharge: 2019-09-07 | Disposition: A | Discharge status: Home / Self Care | Payer: Medicare Other | Attending: Gastroenterology | Admitting: Gastroenterology

## 2019-09-07 ENCOUNTER — Other Ambulatory Visit: Payer: Self-pay

## 2019-09-07 ENCOUNTER — Encounter
Admission: RE | Disposition: A | Discharge status: Home / Self Care | Payer: Self-pay | Source: Home / Self Care | Attending: Gastroenterology

## 2019-09-07 DIAGNOSIS — G4730 Sleep apnea, unspecified: Secondary | ICD-10-CM | POA: Diagnosis not present

## 2019-09-07 DIAGNOSIS — Z7902 Long term (current) use of antithrombotics/antiplatelets: Secondary | ICD-10-CM | POA: Diagnosis not present

## 2019-09-07 DIAGNOSIS — Z7982 Long term (current) use of aspirin: Secondary | ICD-10-CM | POA: Diagnosis not present

## 2019-09-07 DIAGNOSIS — R1310 Dysphagia, unspecified: Secondary | ICD-10-CM | POA: Insufficient documentation

## 2019-09-07 DIAGNOSIS — K2980 Duodenitis without bleeding: Secondary | ICD-10-CM | POA: Diagnosis not present

## 2019-09-07 DIAGNOSIS — Z8546 Personal history of malignant neoplasm of prostate: Secondary | ICD-10-CM | POA: Insufficient documentation

## 2019-09-07 DIAGNOSIS — K210 Gastro-esophageal reflux disease with esophagitis: Secondary | ICD-10-CM | POA: Diagnosis not present

## 2019-09-07 DIAGNOSIS — I10 Essential (primary) hypertension: Secondary | ICD-10-CM | POA: Diagnosis not present

## 2019-09-07 DIAGNOSIS — Z79899 Other long term (current) drug therapy: Secondary | ICD-10-CM | POA: Diagnosis not present

## 2019-09-07 DIAGNOSIS — E785 Hyperlipidemia, unspecified: Secondary | ICD-10-CM | POA: Insufficient documentation

## 2019-09-07 DIAGNOSIS — E781 Pure hyperglyceridemia: Secondary | ICD-10-CM | POA: Diagnosis not present

## 2019-09-07 DIAGNOSIS — K319 Disease of stomach and duodenum, unspecified: Secondary | ICD-10-CM | POA: Diagnosis not present

## 2019-09-07 DIAGNOSIS — Z955 Presence of coronary angioplasty implant and graft: Secondary | ICD-10-CM | POA: Diagnosis not present

## 2019-09-07 DIAGNOSIS — K225 Diverticulum of esophagus, acquired: Secondary | ICD-10-CM | POA: Insufficient documentation

## 2019-09-07 DIAGNOSIS — I2510 Atherosclerotic heart disease of native coronary artery without angina pectoris: Secondary | ICD-10-CM | POA: Insufficient documentation

## 2019-09-07 HISTORY — DX: Polyp of colon: K63.5

## 2019-09-07 HISTORY — DX: Gastro-esophageal reflux disease without esophagitis: K21.9

## 2019-09-07 HISTORY — PX: ESOPHAGOGASTRODUODENOSCOPY (EGD) WITH PROPOFOL: SHX5813

## 2019-09-07 SURGERY — ESOPHAGOGASTRODUODENOSCOPY (EGD) WITH PROPOFOL
Anesthesia: General

## 2019-09-07 MED ORDER — PROPOFOL 500 MG/50ML IV EMUL
INTRAVENOUS | Status: DC | PRN
  Administered 2019-09-07: 180 ug/kg/min via INTRAVENOUS

## 2019-09-07 MED ORDER — FENTANYL CITRATE (PF) 100 MCG/2ML IJ SOLN
INTRAMUSCULAR | Status: AC
  Filled 2019-09-07: qty 2

## 2019-09-07 MED ORDER — PROPOFOL 10 MG/ML IV BOLUS
INTRAVENOUS | Status: DC | PRN
  Administered 2019-09-07: 60 mg via INTRAVENOUS

## 2019-09-07 MED ORDER — LIDOCAINE HCL (CARDIAC) PF 100 MG/5ML IV SOSY
PREFILLED_SYRINGE | INTRAVENOUS | Status: DC | PRN
  Administered 2019-09-07: 100 mg via INTRAVENOUS

## 2019-09-07 MED ORDER — FENTANYL CITRATE (PF) 100 MCG/2ML IJ SOLN
INTRAMUSCULAR | Status: DC | PRN
  Administered 2019-09-07: 25 ug via INTRAVENOUS

## 2019-09-07 MED ORDER — PROPOFOL 10 MG/ML IV BOLUS
INTRAVENOUS | Status: AC
  Filled 2019-09-07: qty 20

## 2019-09-07 MED ORDER — GLYCOPYRROLATE 0.2 MG/ML IJ SOLN
INTRAMUSCULAR | Status: DC | PRN
  Administered 2019-09-07: 0.2 mg via INTRAVENOUS

## 2019-09-07 MED ORDER — SODIUM CHLORIDE 0.9 % IV SOLN
INTRAVENOUS | Status: DC
  Administered 2019-09-07 (×2): via INTRAVENOUS

## 2019-09-07 NOTE — Transfer of Care (Signed)
Immediate Anesthesia Transfer of Care Note  Patient: Richard Melton  Procedure(s) Performed: ESOPHAGOGASTRODUODENOSCOPY (EGD) WITH PROPOFOL (N/A )  Patient Location: PACU  Anesthesia Type:General  Level of Consciousness: sedated  Airway & Oxygen Therapy: Patient Spontanous Breathing and Patient connected to nasal cannula oxygen  Post-op Assessment: Report given to RN and Post -op Vital signs reviewed and stable  Post vital signs: Reviewed and stable  Last Vitals:  Vitals Value Taken Time  BP 127/76 09/07/19 0958  Temp    Pulse 54 09/07/19 0959  Resp 21 09/07/19 0959  SpO2 97 % 09/07/19 0959  Vitals shown include unvalidated device data.  Last Pain:  Vitals:   09/07/19 0906  TempSrc: Oral  PainSc: 0-No pain         Complications: No apparent anesthesia complications

## 2019-09-07 NOTE — Op Note (Signed)
[facility] Gastroenterology Patient Name: Richard Melton Procedure Date: 09/07/2019 9:12 AM MRN: [id number] Account #: [id number] Date of Birth: 1941/03/01 Admit Type: Outpatient Age: 78 Room: [facility] ENDO ROOM 1 Gender: Male Note Status: Finalized Procedure:            Upper GI endoscopy Indications:          Odynophagia, Gastro-esophageal reflux disease Providers:            Lollie Sails, MD Referring MD:         Irven Easterly. Kary Kos, MD (Referring MD) Medicines:            Monitored Anesthesia Care Complications:        No immediate complications. Procedure:            Pre-Anesthesia Assessment:                       - ASA Grade Assessment: III - A patient with severe                        systemic disease.                       After obtaining informed consent, the endoscope was                        passed under direct vision. Throughout the procedure,                        the patient's blood pressure, pulse, and oxygen                        saturations were monitored continuously. The Endoscope                        was introduced through the mouth, and advanced to the                        third part of duodenum. The upper GI endoscopy was                        accomplished without difficulty. The patient tolerated                        the procedure well. Findings:      The Z-line was variable. Biopsies were taken with a cold forceps for       histology.      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      The exam of the esophagus was otherwise normal.      Localized moderate inflammation characterized by congestion (edema) and       erosions was found on the anterior wall of the gastric antrum. Biopsies       were taken with a cold forceps for histology.      The cardia and gastric fundus were normal on retroflexion.      The exam of the  stomach was otherwise normal.      Biopsies were taken with a cold forceps in the gastric body and in the  gastric antrum for histology.      Multiple small mucosal nodules with a patchy distribution were found in       the anterior duodenal bulb, appearance of gastric metaplasia. Biopsies       were taken with a cold forceps for histology.      The exam of the duodenum was otherwise normal.      The cardia and gastric fundus were normal on retroflexion.      Patulous GE junction.      Close inspection of the upper esophagus and posterior pharynx, as       possible with the endoscope showed no other lesions. Impression:           - Z-line variable. Biopsied.                       - Zenker's diverticulum.                       - Gastritis. Biopsied.                       - Mucosal nodule found in the duodenum. Biopsied.                       - Biopsies were taken with a cold forceps for histology                        in the gastric body and in the gastric antrum. Recommendation:       - Return to GI clinic in 1 month. 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 All rights reserved. The codes documented in this report are preliminary and upon coder review may  be revised to meet current compliance requirements. Lollie Sails,  MD 09/07/2019 10:02:10 AM This report has been signed electronically. Number of Addenda: 0 Note Initiated On: 09/07/2019 9:12 AM  [facility]

## 2019-09-07 NOTE — Anesthesia Preprocedure Evaluation (Signed)
Anesthesia Evaluation  Patient identified by MRN, date of birth, ID band Patient awake    Reviewed: Allergy & Precautions, NPO status , Patient's Chart, lab work & pertinent test results, reviewed documented beta blocker date and time   Airway Mallampati: III  TM Distance: >3 FB     Dental  (+) Chipped   Pulmonary sleep apnea ,           Cardiovascular hypertension, Pt. on medications + angina + CAD and + Cardiac Stents       Neuro/Psych  Headaches, Anxiety    GI/Hepatic   Endo/Other    Renal/GU      Musculoskeletal   Abdominal   Peds  Hematology   Anesthesia Other Findings   Reproductive/Obstetrics                             Anesthesia Physical Anesthesia Plan  ASA: III  Anesthesia Plan: General   Post-op Pain Management:    Induction: Intravenous  PONV Risk Score and Plan:   Airway Management Planned:   Additional Equipment:   Intra-op Plan:   Post-operative Plan:   Informed Consent: I have reviewed the patients History and Physical, chart, labs and discussed the procedure including the risks, benefits and alternatives for the proposed anesthesia with the patient or authorized representative who has indicated his/her understanding and acceptance.       Plan Discussed with: CRNA  Anesthesia Plan Comments:         Anesthesia Quick Evaluation

## 2019-09-07 NOTE — H&P (Signed)
Outpatient short stay form Pre-procedure 09/07/2019 9:20 AM Lollie Sails MD  Primary Physician: Dr. [place] Pink  Reason for visit: EGD  History of present illness: Patient is a 78 year old male presenting today for an EGD in regards to history of atypical sensation in the upper throat region.  He has not yet seen ENT in this regard.  He does have a history of GERD.  There is no dysphagia with the exception of an occasional feel like a pill passes slowly.  Does not regurgitate foods.  Does take Plavix.  He has held that for over 5 days.  He has been taking Nexium for a long time.  40 mg a day    Current Facility-Administered Medications:  .  0.9 %  sodium chloride infusion, , Intravenous, Continuous, Lollie Sails, MD, Last Rate: 20 mL/hr at 09/07/19 0913  Medications Prior to Admission  Medication Sig Dispense Refill Last Dose  . amLODipine (NORVASC) 2.5 MG tablet Take 2.5 mg by mouth every evening.    09/06/2019 at Unknown time  . aspirin EC 81 MG tablet Take 81 mg by mouth daily.   09/06/2019 at Unknown time  . B Complex Vitamins (VITAMIN B COMPLEX PO) Take by mouth.   09/02/2019 at Unknown time  . clopidogrel (PLAVIX) 75 MG tablet Take 75 mg by mouth daily.   09/02/2019  . esomeprazole (NEXIUM) 40 MG capsule Take 40 mg by mouth daily.    09/06/2019 at Unknown time  . isosorbide mononitrate (IMDUR) 30 MG 24 hr tablet   11 09/06/2019 at Unknown time  . Melatonin 3 MG TABS Take 3 mg by mouth at bedtime.    09/06/2019 at Unknown time  . mirtazapine (REMERON) 15 MG tablet Take 7.5 mg by mouth at bedtime.   09/06/2019 at Unknown time  . Multiple Vitamins-Minerals (CENTRUM SILVER PO) Take 1 tablet by mouth daily.   Past Week at Unknown time  . olmesartan (BENICAR) 40 MG tablet Take 40 mg by mouth daily.   09/06/2019 at Unknown time  . Probiotic Product (ALIGN) 4 MG CAPS Take 8 mg by mouth daily.    09/06/2019 at Unknown time  . rosuvastatin (CRESTOR) 10 MG tablet Take 10 mg by mouth daily.    09/06/2019 at Unknown time  . sodium chloride (OCEAN) 0.65 % SOLN nasal spray Place 1-2 sprays into both nostrils as needed for congestion.   09/06/2019 at Unknown time  . Turmeric (QC TUMERIC COMPLEX PO) Take by mouth.   Past Week at Unknown time  . acetaminophen (TYLENOL) 500 MG tablet Take 500 mg by mouth every 6 (six) hours as needed for moderate pain or headache.     . fexofenadine (ALLEGRA) 180 MG tablet Take 180 mg by mouth every evening.      . hyoscyamine (LEVSIN, ANASPAZ) 0.125 MG tablet Take 0.125 mg by mouth every 4 (four) hours as needed for cramping.      . mometasone (ELOCON) 0.1 % lotion Apply 4 drops topically daily as needed (for ear itch).     . Omega-3 Fatty Acids (OMEGA 3 PO) Take 1 capsule by mouth 2 (two) times daily.    09/02/2019  . pantoprazole (PROTONIX) 40 MG tablet Take 40 mg by mouth 2 (two) times daily.   Not Taking at Unknown time     Allergies  Allergen Reactions  . Bupropion Nausea Only  . Codeine Nausea Only  . Gemfibrozil Nausea Only  . Oxycodone-Acetaminophen Nausea Only  . Polymyxin B Other (See  Comments)    Unknown  . Sertraline Nausea Only  . Fenofibrate Micronized Anxiety  . Neomycin Rash  . Penicillin G Rash and Other (See Comments)    Has patient had a PCN reaction causing immediate rash, facial/tongue/throat swelling, SOB or lightheadedness with hypotension: No Has patient had a PCN reaction causing severe rash involving mucus membranes or skin necrosis: No Has patient had a PCN reaction that required hospitalization: No - MD office Has patient had a PCN reaction occurring within the last 10 years: No If all of the above answers are "NO", then may proceed with Cephalosporin use.   Iver Nestle [Fenofibrate] Anxiety     Past Medical History:  Diagnosis Date  . Allergic   . Anxiety   . Cancer [facility])    Prostate  . Chronic insomnia   . Colon polyp   . GERD (gastroesophageal reflux disease)   . Headache    Migraines  . Hyperlipidemia   .  Hypertension   . Hypertriglyceridemia   . IBS (irritable bowel syndrome)   . IBS (irritable bowel syndrome)   . Psoriasis   . Sleep apnea     Review of systems:      Physical Exam    Heart and lungs: Regular rate and rhythm without rub or gallop lungs are bilaterally clear    HEENT: Normocephalic atraumatic eyes are anicteric    Other:    Pertinant exam for procedure: Soft nontender nondistended bowel sounds positive normoactive    Planned proceedures: EGD and indicated procedures. I have discussed the risks benefits and complications of procedures to include not limited to bleeding, infection, perforation and the risk of sedation and the patient wishes to proceed.    Lollie Sails, MD Gastroenterology 09/07/2019  9:20 AM

## 2019-09-07 NOTE — Anesthesia Postprocedure Evaluation (Signed)
Anesthesia Post Note  Patient: Richard Melton  Procedure(s) Performed: ESOPHAGOGASTRODUODENOSCOPY (EGD) WITH PROPOFOL (N/A )  Patient location during evaluation: Endoscopy Anesthesia Type: General Level of consciousness: awake and alert Pain management: pain level controlled Vital Signs Assessment: post-procedure vital signs reviewed and stable Respiratory status: spontaneous breathing, nonlabored ventilation, respiratory function stable and patient connected to nasal cannula oxygen Cardiovascular status: blood pressure returned to baseline and stable Postop Assessment: no apparent nausea or vomiting Anesthetic complications: no     Last Vitals:  Vitals:   09/07/19 1009 09/07/19 1023  BP:  (!) 166/82  Pulse:    Resp: 16   Temp:    SpO2:      Last Pain:  Vitals:   09/07/19 1009  TempSrc:   PainSc: 0-No pain                 Devita Nies S

## 2019-09-07 NOTE — Anesthesia Post-op Follow-up Note (Signed)
Anesthesia QCDR form completed.        

## 2019-09-08 ENCOUNTER — Other Ambulatory Visit: Payer: Self-pay | Admitting: Gastroenterology

## 2019-09-08 ENCOUNTER — Encounter: Payer: Self-pay | Admitting: Gastroenterology

## 2019-09-08 DIAGNOSIS — R1310 Dysphagia, unspecified: Secondary | ICD-10-CM

## 2019-09-14 ENCOUNTER — Ambulatory Visit: Payer: Medicare Other

## 2019-09-18 ENCOUNTER — Other Ambulatory Visit: Payer: Self-pay

## 2019-09-18 ENCOUNTER — Ambulatory Visit
Admission: RE | Admit: 2019-09-18 | Discharge: 2019-09-18 | Disposition: A | Discharge status: Home / Self Care | Payer: Medicare Other | Source: Ambulatory Visit | Attending: Gastroenterology | Admitting: Gastroenterology

## 2019-09-18 DIAGNOSIS — R1310 Dysphagia, unspecified: Secondary | ICD-10-CM | POA: Diagnosis not present

## 2019-09-22 LAB — SURGICAL PATHOLOGY

## 2020-08-10 IMAGING — RF DG ESOPHAGUS
9 of 12 series · 14 of 24 positions shown · non-contrast
Comparison: None.

CLINICAL DATA: Hoarseness, dysphagia

EXAM:
ESOPHOGRAM / BARIUM SWALLOW / BARIUM TABLET STUDY
TECHNIQUE: Combined double contrast and single contrast examination performed
using effervescent crystals, thick barium liquid, and thin barium
liquid. The patient was observed with fluoroscopy swallowing a 13 mm
barium sulphate tablet.
FLUOROSCOPY TIME:  Fluoroscopy Time:  0.5 minute
Radiation Exposure Index (if provided by the fluoroscopic device):
2.5 mGy
Number of Acquired Spot Images: 0

[Series 1: cp_standard · 0.25mm/px · 2 of 20 frames shown (1 of 9)]
[frame 4/20]
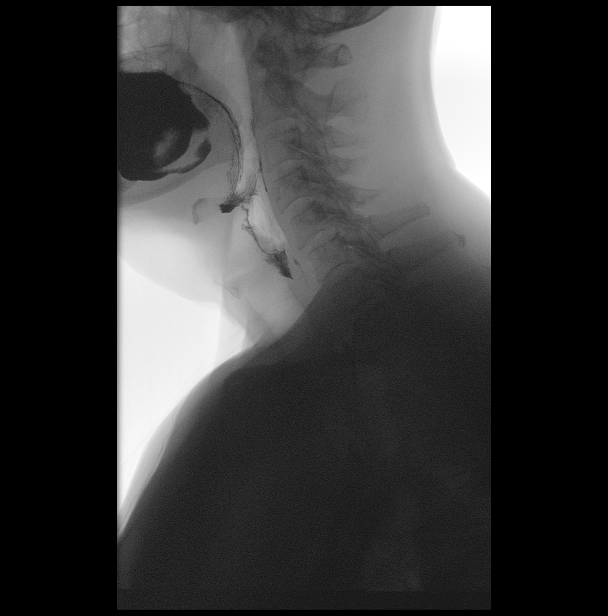
[frame 18/20]
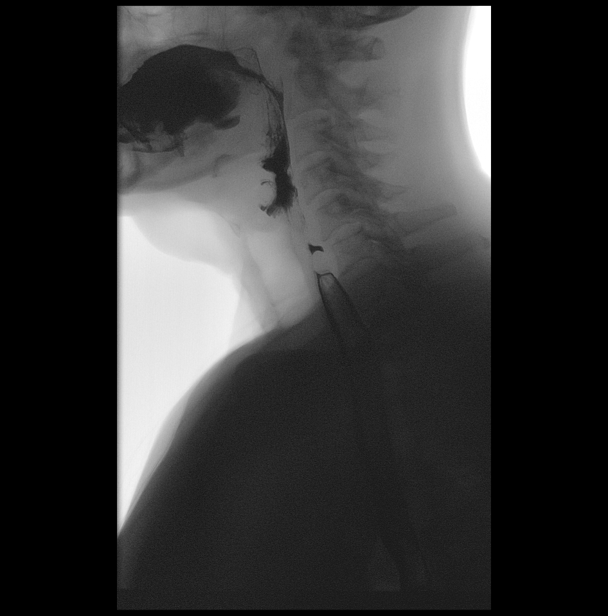

[Series 2: cp_standard · 0.25mm/px · 3 of 21 frames shown (2 of 9)]
[frame 1/21]
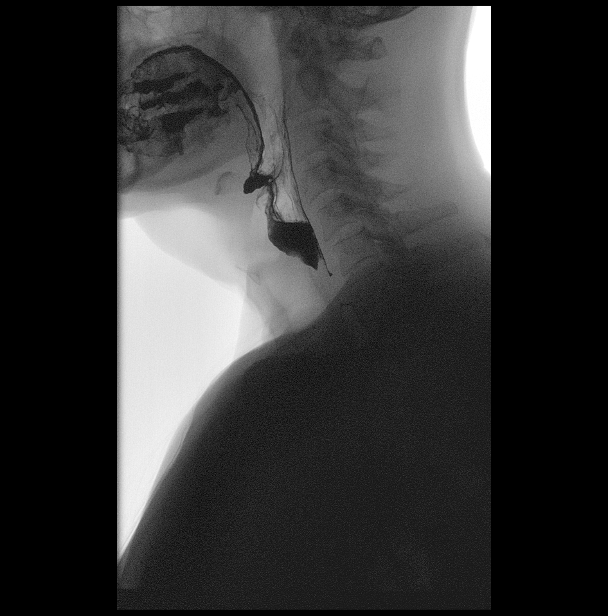
[frame 11/21]
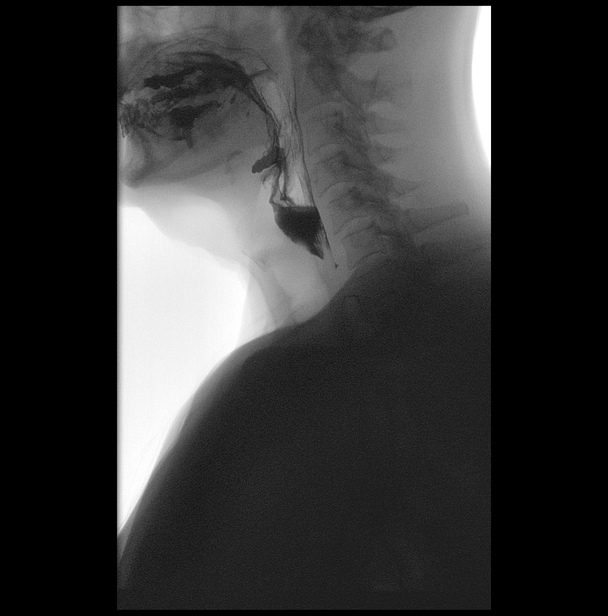
[frame 18/21]
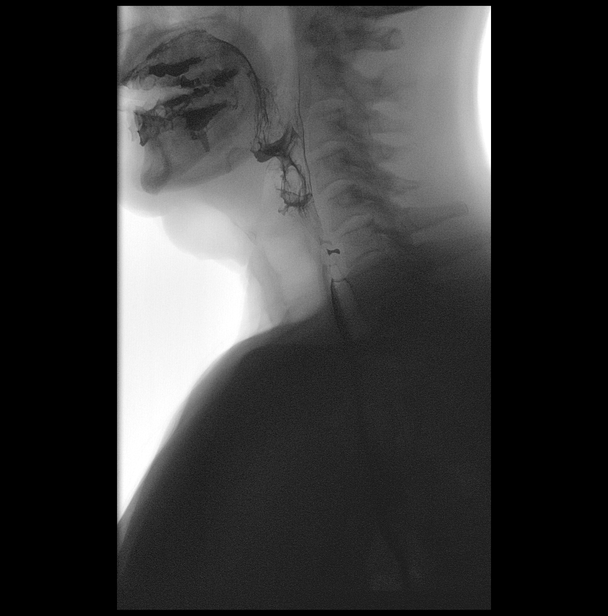

[Series 3: cp_standard · 0.25mm/px · 2 of 20 frames shown (3 of 9)]
[frame 10/20]
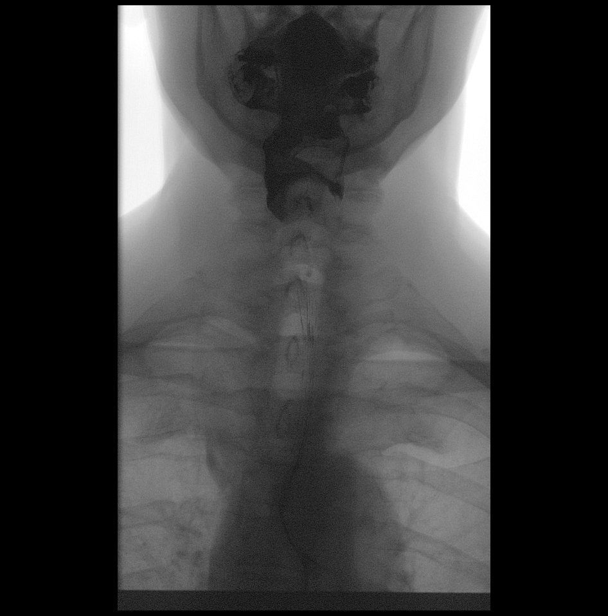
[frame 18/20]
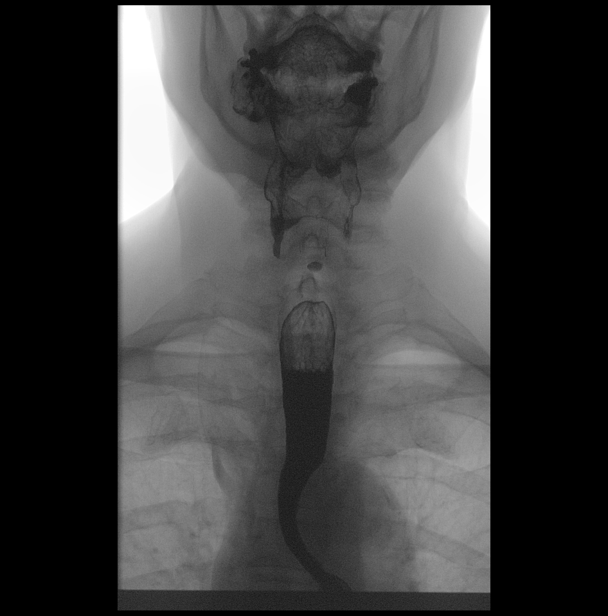

[Series 5: cp_standard · 0.25mm/px · 1 of 1 slices shown (4 of 9)]
[im 1/1]
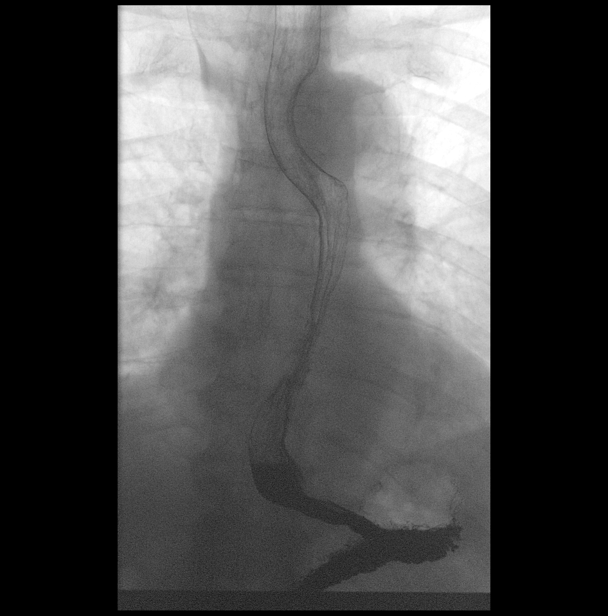

[Series 6: cp_standard · 0.26mm/px · 2 of 37 frames shown (5 of 9)]
[frame 18/37]
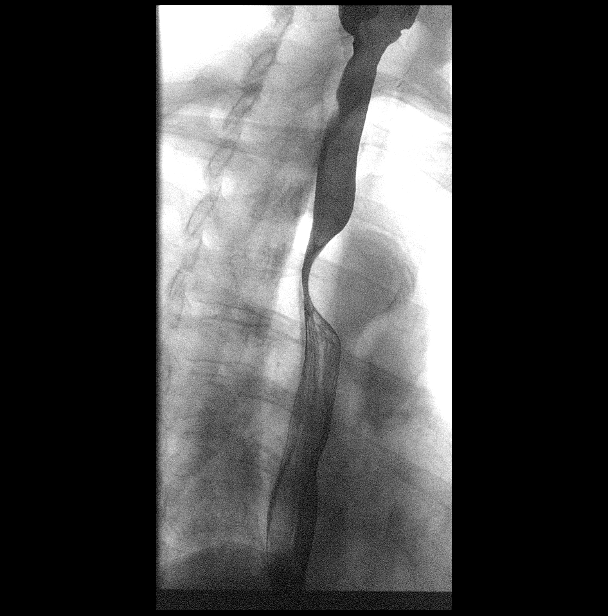
[frame 32/37]
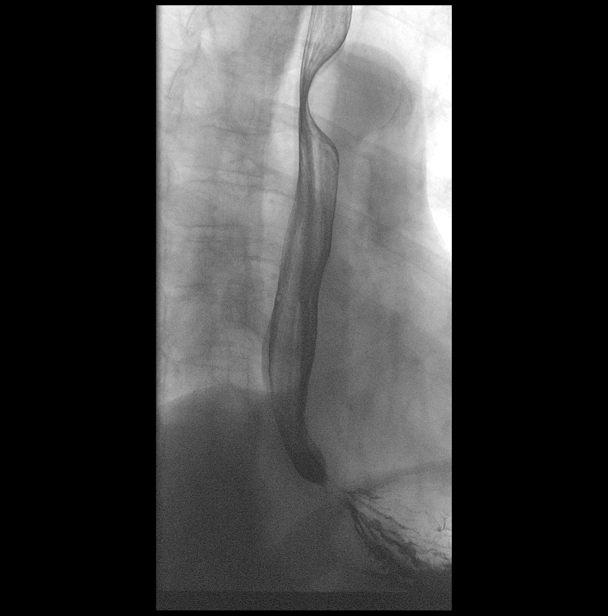

[Series 8: cp_standard · 0.27mm/px · 1 of 1 slices shown (6 of 9)]
[im 1/1]
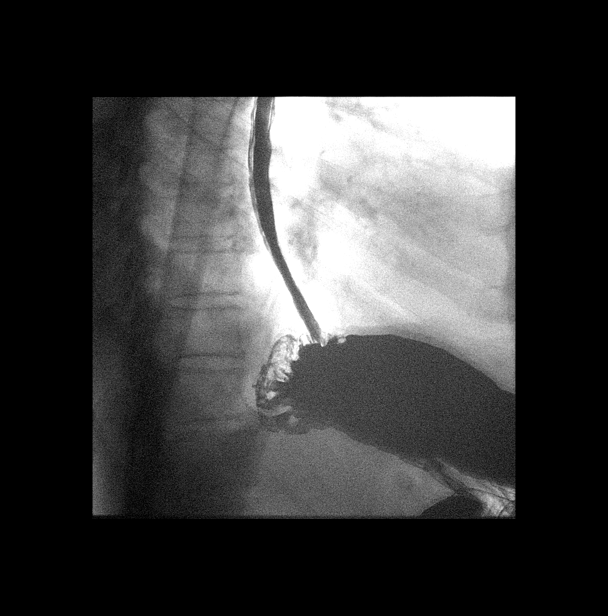

[Series 9: cp_standard · 0.27mm/px · 1 of 1 slices shown (7 of 9)]
[im 1/1]
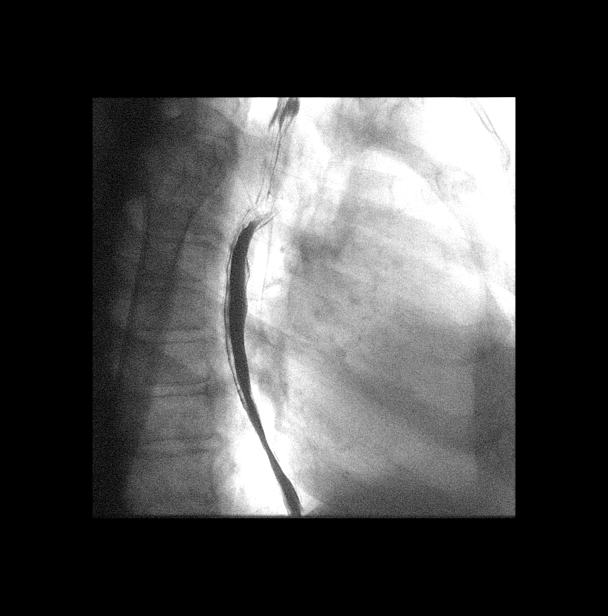

[Series 11: cp_standard · 0.28mm/px · 1 of 1 slices shown (8 of 9)]
[im 1/1]
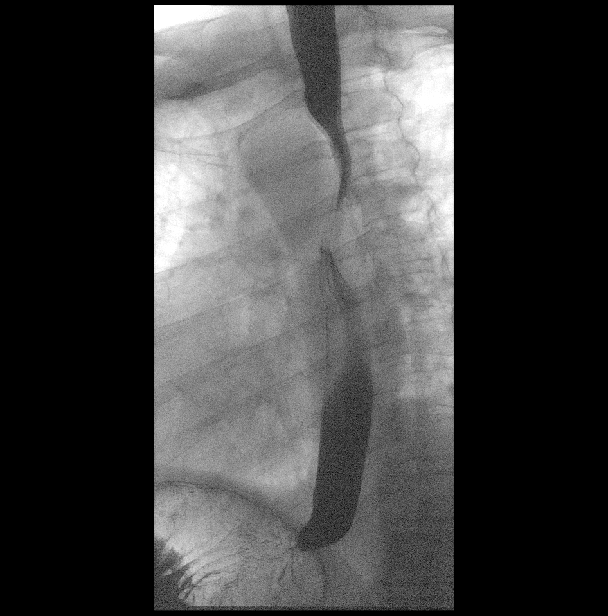

[Series 13: cp_standard · 0.28mm/px · 1 of 1 slices shown (9 of 9)]
[im 1/1]
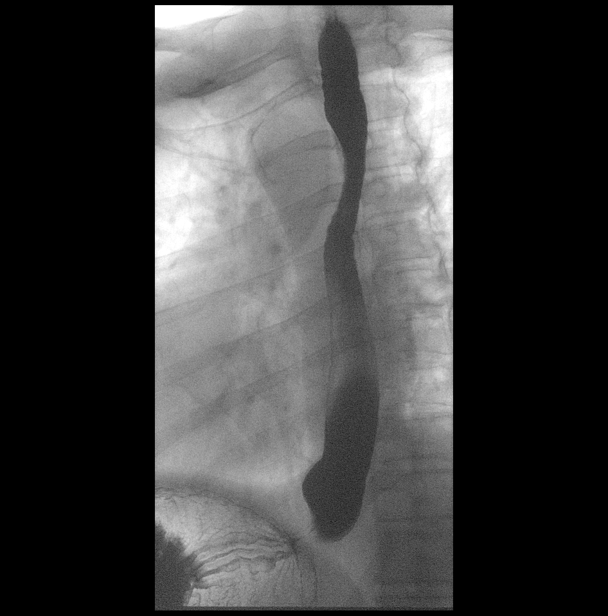

[14 of 24 positions shown; findings below may reference images not displayed]

FINDINGS: Normal pharyngeal anatomy and motility. Contrast flowed freely
through the esophagus without evidence of a stricture or mass. Tiny
Friska diverticulum measuring less than 5 mm without retention of
barium. Normal esophageal mucosa without evidence of irregularity or
ulceration. Esophageal motility was normal. Moderate severity
gastroesophageal reflux. No definite hiatal hernia was demonstrated.

At the end of the examination a 13 mm barium tablet was administered
which transited through the esophagus and esophagogastric junction
without delay.
IMPRESSION: 1. Moderate severity gastroesophageal reflux. No esophageal
stricture.
2. Tiny Friska diverticulum.

## 2021-05-27 ENCOUNTER — Encounter: Payer: Self-pay | Admitting: Internal Medicine

## 2021-05-28 ENCOUNTER — Encounter
Admission: RE | Disposition: A | Discharge status: Home / Self Care | Payer: Self-pay | Source: Home / Self Care | Attending: Internal Medicine

## 2021-05-28 ENCOUNTER — Encounter: Payer: Self-pay | Admitting: Internal Medicine

## 2021-05-28 ENCOUNTER — Ambulatory Visit: Payer: Medicare HMO | Admitting: Certified Registered"

## 2021-05-28 ENCOUNTER — Ambulatory Visit
Admission: RE | Admit: 2021-05-28 | Discharge: 2021-05-28 | Disposition: A | Discharge status: Home / Self Care | Payer: Medicare HMO | Attending: Internal Medicine | Admitting: Internal Medicine

## 2021-05-28 ENCOUNTER — Other Ambulatory Visit: Payer: Self-pay

## 2021-05-28 DIAGNOSIS — Z1211 Encounter for screening for malignant neoplasm of colon: Secondary | ICD-10-CM | POA: Diagnosis present

## 2021-05-28 DIAGNOSIS — Z79899 Other long term (current) drug therapy: Secondary | ICD-10-CM | POA: Diagnosis not present

## 2021-05-28 DIAGNOSIS — Z880 Allergy status to penicillin: Secondary | ICD-10-CM | POA: Diagnosis not present

## 2021-05-28 DIAGNOSIS — D128 Benign neoplasm of rectum: Secondary | ICD-10-CM | POA: Diagnosis not present

## 2021-05-28 DIAGNOSIS — Z7982 Long term (current) use of aspirin: Secondary | ICD-10-CM | POA: Diagnosis not present

## 2021-05-28 DIAGNOSIS — K640 First degree hemorrhoids: Secondary | ICD-10-CM | POA: Diagnosis not present

## 2021-05-28 DIAGNOSIS — Z885 Allergy status to narcotic agent status: Secondary | ICD-10-CM | POA: Insufficient documentation

## 2021-05-28 DIAGNOSIS — Z888 Allergy status to other drugs, medicaments and biological substances status: Secondary | ICD-10-CM | POA: Insufficient documentation

## 2021-05-28 DIAGNOSIS — Z7902 Long term (current) use of antithrombotics/antiplatelets: Secondary | ICD-10-CM | POA: Insufficient documentation

## 2021-05-28 DIAGNOSIS — Z86010 Personal history of colonic polyps: Secondary | ICD-10-CM | POA: Diagnosis not present

## 2021-05-28 HISTORY — PX: COLONOSCOPY WITH PROPOFOL: SHX5780

## 2021-05-28 SURGERY — COLONOSCOPY WITH PROPOFOL
Anesthesia: General

## 2021-05-28 MED ORDER — LIDOCAINE HCL (CARDIAC) PF 100 MG/5ML IV SOSY
PREFILLED_SYRINGE | INTRAVENOUS | Status: DC | PRN
  Administered 2021-05-28: 100 mg via INTRAVENOUS

## 2021-05-28 MED ORDER — PROPOFOL 500 MG/50ML IV EMUL
INTRAVENOUS | Status: DC | PRN
  Administered 2021-05-28: 150 ug/kg/min via INTRAVENOUS

## 2021-05-28 MED ORDER — SODIUM CHLORIDE 0.9 % IV SOLN: INTRAVENOUS | Status: DC

## 2021-05-28 MED ORDER — PROPOFOL 10 MG/ML IV BOLUS
INTRAVENOUS | Status: DC | PRN
  Administered 2021-05-28: 90 mg via INTRAVENOUS

## 2021-05-28 MED ORDER — PROPOFOL 500 MG/50ML IV EMUL
INTRAVENOUS | Status: AC
  Filled 2021-05-28: qty 50

## 2021-05-28 NOTE — Op Note (Signed)
[facility] Gastroenterology Patient Name: Richard Melton Procedure Date: 05/28/2021 11:20 AM MRN: [id number] Account #: [id number] Date of Birth: [birth year] Admit Type: Outpatient Age: 80 Room: [facility] ENDO ROOM 2 Gender: Male Note Status: Finalized Procedure:             Colonoscopy Indications:           Surveillance: Personal history of adenomatous polyps                         on last colonoscopy > 5 years ago Providers:             Lorie Apley K. Alice Reichert MD, MD Referring MD:          Irven Easterly. Kary Kos, MD (Referring MD) Medicines:             Propofol per Anesthesia Complications:         No immediate complications. Procedure:             Pre-Anesthesia Assessment:                        - The risks and benefits of the procedure and the                         sedation options and risks were discussed with the                         patient. All questions were answered and informed                         consent was obtained.                        - Patient identification and proposed procedure were                         verified prior to the procedure by the nurse. The                         procedure was verified in the procedure room.                        - ASA Grade Assessment: III - A patient with severe                         systemic disease.                        - After reviewing the risks and benefits, the patient                         was deemed in satisfactory condition to undergo the                         procedure.                        After obtaining informed consent, the colonoscope was                         passed under direct  vision. Throughout the procedure,                         the patient's blood pressure, pulse, and oxygen                         saturations were monitored continuously. The                         Colonoscope was introduced through the anus and                         advanced to the the cecum, identified by  appendiceal                         orifice and ileocecal valve. The colonoscopy was                         performed without difficulty. The patient tolerated                         the procedure well. The quality of the bowel                         preparation was excellent. The ileocecal valve,                         appendiceal orifice, and rectum were photographed. Findings:      The perianal and digital rectal examinations were normal. Pertinent       negatives include normal sphincter tone and no palpable rectal lesions.      Non-bleeding internal hemorrhoids were found during retroflexion. The       hemorrhoids were Grade I (internal hemorrhoids that do not prolapse).      Three sessile polyps were found in the rectum. The polyps were 2 to 3 mm       in size. These polyps were removed with a jumbo cold forceps. Resection       and retrieval were complete.      The exam was otherwise without abnormality. Impression:            - Non-bleeding internal hemorrhoids.                        - Three 2 to 3 mm polyps in the rectum, removed with a                         jumbo cold forceps. Resected and retrieved.                        - The examination was otherwise normal. Recommendation:        - Patient has a contact number available for                         emergencies. The signs and symptoms of potential                         delayed complications were discussed with the patient.  Return to normal activities tomorrow. Written                         discharge instructions were provided to the patient.                        - Resume previous diet.                        - Continue present medications.                        - If polyps are benign or adenomatous without                         dysplasia, I will advise NO further colonoscopy due to                         advanced age and/or severe comorbidity.                        - You do NOT  require further colon cancer screening                         measures (Annual stool testing (i.e. hemoccult, FIT,                         cologuard), sigmoidoscopy, colonoscopy or CT                         colonography). You should share this recommendation                         with your Primary Care provider.                        - Return to GI office PRN.                        - The findings and recommendations were discussed with                         the patient. Procedure Code(s):     --- Professional ---                        [phone], Colonoscopy, flexible; with biopsy, single or                         multiple Diagnosis Code(s):     --- Professional ---                        K64.0, First degree hemorrhoids                        K62.1, Rectal polyp                        Z86.010, Personal history of colonic polyps CPT copyright 2019 American Medical Association. All rights reserved. The codes documented in this report are preliminary and upon coder review may  be revised to meet current compliance requirements. Efrain Sella MD, MD 05/28/2021 11:56:54 AM This report has been signed electronically. Number of Addenda: 0 Note Initiated On: 05/28/2021 11:20 AM Scope Withdrawal Time: 0 hours 7 minutes 59 seconds  Total Procedure Duration: 0 hours 13 minutes 38 seconds  Estimated Blood Loss:  Estimated blood loss: none.      [facility]

## 2021-05-28 NOTE — Transfer of Care (Signed)
Immediate Anesthesia Transfer of Care Note  Patient: Richard Melton  Procedure(s) Performed: COLONOSCOPY WITH PROPOFOL (N/A )  Patient Location: PACU  Anesthesia Type:General  Level of Consciousness: drowsy  Airway & Oxygen Therapy: Patient Spontanous Breathing  Post-op Assessment: Report given to RN and Post -op Vital signs reviewed and stable  Post vital signs: stable  Last Vitals:  Vitals Value Taken Time  BP 117/67 05/28/21 1158  Temp    Pulse 48 05/28/21 1158  Resp 14 05/28/21 1158  SpO2 96 % 05/28/21 1158    Last Pain:  Vitals:   05/28/21 1006  TempSrc: Temporal  PainSc: 0-No pain         Complications: No complications documented.

## 2021-05-28 NOTE — Interval H&P Note (Signed)
History and Physical Interval Note:  05/28/2021 11:16 AM  Richard Melton  has presented today for surgery, with the diagnosis of History of polyps.  The various methods of treatment have been discussed with the patient and family. After consideration of risks, benefits and other options for treatment, the patient has consented to  Procedure(s): COLONOSCOPY WITH PROPOFOL (N/A) as a surgical intervention.  The patient's history has been reviewed, patient examined, no change in status, stable for surgery.  I have reviewed the patient's chart and labs.  Questions were answered to the patient's satisfaction.     [name], [place]

## 2021-05-28 NOTE — Anesthesia Preprocedure Evaluation (Signed)
Anesthesia Evaluation  Patient identified by MRN, date of birth, ID band Patient awake    Reviewed: Allergy & Precautions, H&P , NPO status , Patient's Chart, lab work & pertinent test results, reviewed documented beta blocker date and time   Airway Mallampati: II   Neck ROM: full    Dental  (+) Poor Dentition, Teeth Intact   Pulmonary sleep apnea and Continuous Positive Airway Pressure Ventilation ,    Pulmonary exam normal        Cardiovascular Exercise Tolerance: Good hypertension, On Medications + angina with exertion + CAD  Normal cardiovascular exam Rhythm:regular Rate:Normal     Neuro/Psych  Headaches, Anxiety negative psych ROS   GI/Hepatic Neg liver ROS, GERD  Medicated,  Endo/Other  negative endocrine ROS  Renal/GU negative Renal ROS  negative genitourinary   Musculoskeletal   Abdominal   Peds  Hematology negative hematology ROS (+)   Anesthesia Other Findings Past Medical History: No date: Allergic No date: Anxiety No date: Cancer [facility])     Comment:  Prostate No date: Chronic insomnia No date: Colon polyp No date: GERD (gastroesophageal reflux disease) No date: Headache     Comment:  Migraines No date: Hyperlipidemia No date: Hypertension No date: Hypertriglyceridemia No date: IBS (irritable bowel syndrome) No date: IBS (irritable bowel syndrome) No date: Psoriasis No date: Sleep apnea Past Surgical History: No date: CIRCUMCISION 07/06/2017: COLONOSCOPY WITH PROPOFOL; N/A     Comment:  Procedure: COLONOSCOPY WITH PROPOFOL;  Surgeon:               Lollie Sails, MD;  Location: [facility] ENDOSCOPY;                Service: Endoscopy;  Laterality: N/A; 03/15/2018: CORONARY STENT INTERVENTION; N/A     Comment:  Procedure: CORONARY STENT INTERVENTION;  Surgeon:               Isaias Cowman, MD;  Location: [place] [facility]               LAB;  Service: Cardiovascular;  Laterality:  N/A; 09/07/2019: ESOPHAGOGASTRODUODENOSCOPY (EGD) WITH PROPOFOL; N/A     Comment:  Procedure: ESOPHAGOGASTRODUODENOSCOPY (EGD) WITH               PROPOFOL;  Surgeon: Lollie Sails, MD;  Location:               [facility] ENDOSCOPY;  Service: Endoscopy;  Laterality: N/A; 03/15/2018: LEFT HEART CATH AND CORONARY ANGIOGRAPHY; Left     Comment:  Procedure: LEFT HEART CATH AND CORONARY ANGIOGRAPHY;                Surgeon: Isaias Cowman, MD;  Location: [place] [facility] LAB;  Service: Cardiovascular;  Laterality:               Left; No date: PROSTATECTOMY No date: TONSILLECTOMY BMI    Body Mass Index: 26.45 kg/m     Reproductive/Obstetrics negative OB ROS                             Anesthesia Physical Anesthesia Plan  ASA: III  Anesthesia Plan: General   Post-op Pain Management:    Induction:   PONV Risk Score and Plan:   Airway Management Planned:   Additional Equipment:   Intra-op Plan:   Post-operative Plan:   Informed Consent: I have reviewed  the patients History and Physical, chart, labs and discussed the procedure including the risks, benefits and alternatives for the proposed anesthesia with the patient or authorized representative who has indicated his/her understanding and acceptance.     Dental Advisory Given  Plan Discussed with: CRNA  Anesthesia Plan Comments:         Anesthesia Quick Evaluation

## 2021-05-28 NOTE — H&P (Signed)
Outpatient short stay form Pre-procedure 05/28/2021 11:16 AM Alverda Nazzaro K. Alice Reichert, M.D.  Primary Physician: Maryland Pink, M.D.  Reason for visit:  Adenomatous colon polyps x 4. 2018.  History of present illness:                            Patient presents for colonoscopy for a personal hx of colon polyps. The patient denies abdominal pain, abnormal weight loss or rectal bleeding.      Current Facility-Administered Medications:  .  0.9 %  sodium chloride infusion, , Intravenous, Continuous, [place], Benay Pike, MD, Last Rate: 20 mL/hr at 05/28/21 1026, New Bag at 05/28/21 1026  Medications Prior to Admission  Medication Sig Dispense Refill Last Dose  . amLODipine (NORVASC) 2.5 MG tablet Take 2.5 mg by mouth every evening.    Past Week at Unknown time  . aspirin EC 81 MG tablet Take 81 mg by mouth daily.   05/27/2021 at Unknown time  . B Complex Vitamins (VITAMIN B COMPLEX PO) Take by mouth.   05/27/2021 at Unknown time  . esomeprazole (NEXIUM) 40 MG capsule Take 40 mg by mouth daily.    05/27/2021 at Unknown time  . fexofenadine (ALLEGRA) 180 MG tablet Take 180 mg by mouth every evening.    Past Week at Unknown time  . hyoscyamine (LEVSIN, ANASPAZ) 0.125 MG tablet Take 0.125 mg by mouth every 4 (four) hours as needed for cramping.    Past Month at Unknown time  . mirtazapine (REMERON) 15 MG tablet Take 7.5 mg by mouth at bedtime.   Past Week at Unknown time  . mometasone (ELOCON) 0.1 % lotion Apply 4 drops topically daily as needed (for ear itch).   Past Month at Unknown time  . Multiple Vitamins-Minerals (CENTRUM SILVER PO) Take 1 tablet by mouth daily.   05/27/2021 at Unknown time  . olmesartan (BENICAR) 40 MG tablet Take 40 mg by mouth daily.   05/28/2021 at Unknown time  . Omega-3 Fatty Acids (OMEGA 3 PO) Take 1 capsule by mouth 2 (two) times daily.    05/27/2021 at Unknown time  . pantoprazole (PROTONIX) 40 MG tablet Take 40 mg by mouth 2 (two) times daily.   05/27/2021 at Unknown time  . Probiotic  Product (ALIGN) 4 MG CAPS Take 8 mg by mouth daily.    05/27/2021 at Unknown time  . rosuvastatin (CRESTOR) 10 MG tablet Take 10 mg by mouth daily.   05/27/2021 at Unknown time  . Turmeric (QC TUMERIC COMPLEX PO) Take by mouth.   05/27/2021 at Unknown time  . acetaminophen (TYLENOL) 500 MG tablet Take 500 mg by mouth every 6 (six) hours as needed for moderate pain or headache.     . clopidogrel (PLAVIX) 75 MG tablet Take 75 mg by mouth daily.   05/22/2021  . isosorbide mononitrate (IMDUR) 30 MG 24 hr tablet  (Patient not taking: Reported on 05/28/2021)  11 Not Taking at Unknown time  . Melatonin 3 MG TABS Take 3 mg by mouth at bedtime.      . sodium chloride (OCEAN) 0.65 % SOLN nasal spray Place 1-2 sprays into both nostrils as needed for congestion.        Allergies  Allergen Reactions  . Bupropion Nausea Only  . Codeine Nausea Only  . Gemfibrozil Nausea Only  . Oxycodone-Acetaminophen Nausea Only  . Polymyxin B Other (See Comments)    Unknown  . Sertraline Nausea Only  . Fenofibrate  Micronized Anxiety  . Neomycin Rash  . Penicillin G Rash and Other (See Comments)    Has patient had a PCN reaction causing immediate rash, facial/tongue/throat swelling, SOB or lightheadedness with hypotension: No Has patient had a PCN reaction causing severe rash involving mucus membranes or skin necrosis: No Has patient had a PCN reaction that required hospitalization: No - MD office Has patient had a PCN reaction occurring within the last 10 years: No If all of the above answers are "NO", then may proceed with Cephalosporin use.   Iver Nestle [Fenofibrate] Anxiety     Past Medical History:  Diagnosis Date  . Allergic   . Anxiety   . Cancer [facility])    Prostate  . Chronic insomnia   . Colon polyp   . GERD (gastroesophageal reflux disease)   . Headache    Migraines  . Hyperlipidemia   . Hypertension   . Hypertriglyceridemia   . IBS (irritable bowel syndrome)   . IBS (irritable bowel syndrome)   .  Psoriasis   . Sleep apnea     Review of systems:  Otherwise negative.    Physical Exam  Gen: Alert, oriented. Appears stated age.  HEENT: [place]/AT. PERRLA. Lungs: CTA, no wheezes. CV: RR nl S1, S2. Abd: soft, benign, no masses. BS+ Ext: No edema. Pulses 2+    Planned procedures: Proceed with colonoscopy. The patient understands the nature of the planned procedure, indications, risks, alternatives and potential complications including but not limited to bleeding, infection, perforation, damage to internal organs and possible oversedation/side effects from anesthesia. The patient agrees and gives consent to proceed.  Please refer to procedure notes for findings, recommendations and patient disposition/instructions.     Cabrini Ruggieri K. Alice Reichert, M.D. Gastroenterology 05/28/2021  11:16 AM

## 2021-05-29 ENCOUNTER — Encounter: Payer: Self-pay | Admitting: Internal Medicine

## 2021-05-29 LAB — SURGICAL PATHOLOGY

## 2021-05-29 NOTE — Anesthesia Postprocedure Evaluation (Signed)
Anesthesia Post Note  Patient: Richard Melton  Procedure(s) Performed: COLONOSCOPY WITH PROPOFOL  Patient location during evaluation: PACU Anesthesia Type: General Level of consciousness: awake and alert Pain management: pain level controlled Vital Signs Assessment: post-procedure vital signs reviewed and stable Respiratory status: spontaneous breathing, nonlabored ventilation, respiratory function stable and patient connected to nasal cannula oxygen Cardiovascular status: blood pressure returned to baseline and stable Postop Assessment: no apparent nausea or vomiting Anesthetic complications: no   No notable events documented.   Last Vitals:  Vitals:   05/28/21 1230 05/28/21 1231  BP: (!) 152/82   Pulse: (!) 102 96  Resp: 10 (!) 9  Temp:    SpO2: 100% 95%    Last Pain:  Vitals:   05/29/21 0812  TempSrc:   PainSc: 0-No pain                 Molli Barrows

## 2021-06-12 ENCOUNTER — Emergency Department
Admission: EM | Admit: 2021-06-12 | Discharge: 2021-06-12 | Disposition: A | Discharge status: Home / Self Care | Payer: Medicare HMO | Attending: Emergency Medicine | Admitting: Emergency Medicine

## 2021-06-12 ENCOUNTER — Other Ambulatory Visit: Payer: Self-pay

## 2021-06-12 ENCOUNTER — Emergency Department: Payer: Medicare HMO

## 2021-06-12 DIAGNOSIS — Z7902 Long term (current) use of antithrombotics/antiplatelets: Secondary | ICD-10-CM | POA: Diagnosis not present

## 2021-06-12 DIAGNOSIS — Z955 Presence of coronary angioplasty implant and graft: Secondary | ICD-10-CM | POA: Insufficient documentation

## 2021-06-12 DIAGNOSIS — R112 Nausea with vomiting, unspecified: Secondary | ICD-10-CM | POA: Diagnosis present

## 2021-06-12 DIAGNOSIS — Z7982 Long term (current) use of aspirin: Secondary | ICD-10-CM | POA: Diagnosis not present

## 2021-06-12 DIAGNOSIS — Z8546 Personal history of malignant neoplasm of prostate: Secondary | ICD-10-CM | POA: Insufficient documentation

## 2021-06-12 DIAGNOSIS — Z79899 Other long term (current) drug therapy: Secondary | ICD-10-CM | POA: Insufficient documentation

## 2021-06-12 DIAGNOSIS — I10 Essential (primary) hypertension: Secondary | ICD-10-CM | POA: Diagnosis not present

## 2021-06-12 DIAGNOSIS — K529 Noninfective gastroenteritis and colitis, unspecified: Secondary | ICD-10-CM | POA: Diagnosis not present

## 2021-06-12 DIAGNOSIS — I2510 Atherosclerotic heart disease of native coronary artery without angina pectoris: Secondary | ICD-10-CM | POA: Insufficient documentation

## 2021-06-12 LAB — CBC
HCT: 42.3 % (ref 39.0–52.0)
Hemoglobin: 14.8 g/dL (ref 13.0–17.0)
MCH: 30.1 pg (ref 26.0–34.0)
MCHC: 35 g/dL (ref 30.0–36.0)
MCV: 86.2 fL (ref 80.0–100.0)
Platelets: 112 10*3/uL — ABNORMAL LOW (ref 150–400)
RBC: 4.91 MIL/uL (ref 4.22–5.81)
RDW: 13.5 % (ref 11.5–15.5)
WBC: 7.5 10*3/uL (ref 4.0–10.5)
nRBC: 0 % (ref 0.0–0.2)

## 2021-06-12 LAB — COMPREHENSIVE METABOLIC PANEL
ALT: 19 U/L (ref 0–44)
AST: 28 U/L (ref 15–41)
Albumin: 4.2 g/dL (ref 3.5–5.0)
Alkaline Phosphatase: 53 U/L (ref 38–126)
Anion gap: 7 (ref 5–15)
BUN: 23 mg/dL (ref 8–23)
CO2: 27 mmol/L (ref 22–32)
Calcium: 8.8 mg/dL — ABNORMAL LOW (ref 8.9–10.3)
Chloride: 106 mmol/L (ref 98–111)
Creatinine, Ser: 1.02 mg/dL (ref 0.61–1.24)
GFR, Estimated: >60 mL/min (ref >60)
Glucose, Bld: 116 mg/dL — ABNORMAL HIGH (ref 70–99)
Potassium: 3.6 mmol/L (ref 3.5–5.1)
Sodium: 140 mmol/L (ref 135–145)
Total Bilirubin: 1.5 mg/dL — ABNORMAL HIGH (ref 0.3–1.2)
Total Protein: 7.5 g/dL (ref 6.5–8.1)

## 2021-06-12 LAB — LIPASE, BLOOD: Lipase: 20 U/L (ref 11–51)

## 2021-06-12 MED ORDER — ONDANSETRON 4 MG PO TBDP: 4.0000 mg | ORAL_TABLET | Freq: Three times a day (TID) | ORAL | 0 refills | Status: DC | PRN

## 2021-06-12 MED ORDER — ONDANSETRON HCL 4 MG/2ML IJ SOLN
4.0000 mg | Freq: Once | INTRAMUSCULAR | Status: AC
  Administered 2021-06-12: 4 mg via INTRAVENOUS
  Filled 2021-06-12: qty 2

## 2021-06-12 MED ORDER — SODIUM CHLORIDE 0.9 % IV SOLN
1000.0000 mL | Freq: Once | INTRAVENOUS | Status: AC
  Administered 2021-06-12: 1000 mL via INTRAVENOUS

## 2021-06-12 MED ORDER — IOHEXOL 300 MG/ML  SOLN
100.0000 mL | Freq: Once | INTRAMUSCULAR | Status: AC | PRN
  Administered 2021-06-12: 100 mL via INTRAVENOUS
  Filled 2021-06-12: qty 100

## 2021-06-12 NOTE — ED Triage Notes (Signed)
Nausea, vomiting and diarrhea starting last pm.  Last episode at 10 am.

## 2021-06-12 NOTE — ED Provider Notes (Signed)
[facility] Emergency Department Provider Note   ____________________________________________    I have reviewed the triage vital signs and the nursing notes.   HISTORY  Chief Complaint Emesis and Diarrhea     HPI Richard Melton is a 80 y.o. male who presents with complaints of nausea vomiting and diarrhea.  The symptoms started last night and kept him up all night long.  He reports he is feeling improved currently, no nausea at this time, he thinks he is "dried out ".  He does complain of some pain in his right lower quadrant.  Reports this has been going on for a couple of weeks.  He does have an appointment with his physician to evaluate this.  Did have chills last night.  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   Essential hypertension 03/07/2018   Hyperlipemia, mixed 03/07/2018    Past Surgical History:  Procedure Laterality Date   CIRCUMCISION     COLONOSCOPY WITH PROPOFOL N/A 07/06/2017   Procedure: COLONOSCOPY WITH PROPOFOL;  Surgeon: Lollie Sails, MD;  Location: [facility] ENDOSCOPY;  Service: Endoscopy;  Laterality: N/A;   COLONOSCOPY WITH PROPOFOL N/A 05/28/2021   Procedure: COLONOSCOPY WITH PROPOFOL;  Surgeon: [place], Benay Pike, MD;  Location: [facility] ENDOSCOPY;  Service: Gastroenterology;  Laterality: N/A;   CORONARY STENT INTERVENTION N/A 03/15/2018   Procedure: CORONARY STENT INTERVENTION;  Surgeon: Isaias Cowman, MD;  Location: [place] [facility] LAB;  Service: Cardiovascular;  Laterality: N/A;   ESOPHAGOGASTRODUODENOSCOPY (EGD) WITH PROPOFOL N/A 09/07/2019   Procedure: ESOPHAGOGASTRODUODENOSCOPY (EGD) WITH PROPOFOL;  Surgeon: Lollie Sails, MD;  Location: [facility] ENDOSCOPY;  Service: Endoscopy;  Laterality: N/A;   LEFT HEART CATH AND CORONARY ANGIOGRAPHY Left 03/15/2018   Procedure: LEFT HEART CATH AND CORONARY ANGIOGRAPHY;  Surgeon: Isaias Cowman, MD;  Location: [place] [facility] LAB;  Service: Cardiovascular;  Laterality: Left;   PROSTATECTOMY     TONSILLECTOMY      Prior to Admission medications   Medication Sig Start Date End Date Taking? Authorizing Provider  acetaminophen (TYLENOL) 500 MG tablet Take 500 mg by mouth every 6 (six) hours as needed for moderate pain or headache.    [provider]  amLODipine (NORVASC) 2.5 MG tablet Take 2.5 mg by mouth every evening.     [provider]  aspirin EC 81 MG tablet Take 81 mg by mouth daily.    [provider]  B Complex Vitamins (VITAMIN B COMPLEX PO) Take by mouth.    [provider]  clopidogrel (PLAVIX) 75 MG tablet Take 75 mg by mouth daily.    [provider]  esomeprazole (NEXIUM) 40 MG capsule Take 40 mg by mouth daily.     [provider]  fexofenadine (ALLEGRA) 180 MG tablet Take 180 mg by mouth every evening.  [provider]  hyoscyamine (LEVSIN, ANASPAZ) 0.125 MG tablet Take 0.125 mg by mouth every 4 (four) hours as needed for cramping.     [provider]  isosorbide mononitrate (IMDUR) 30 MG 24 hr tablet  03/23/18   [provider]  Melatonin 3 MG TABS Take 3 mg by mouth at bedtime.     [provider]  mirtazapine (REMERON) 15 MG tablet Take 7.5 mg by mouth at bedtime.    [provider]  mometasone (ELOCON) 0.1 % lotion Apply 4 drops topically daily as needed (for ear itch).    [provider]  Multiple  Vitamins-Minerals (CENTRUM SILVER PO) Take 1 tablet by mouth daily.    [provider]  olmesartan (BENICAR) 40 MG tablet Take 40 mg by mouth daily.    [provider]  Omega-3 Fatty Acids (OMEGA 3 PO) Take 1 capsule by mouth 2 (two) times daily.     [provider]  pantoprazole (PROTONIX) 40 MG tablet Take 40 mg by mouth 2 (two) times daily.    [provider]  Probiotic Product (ALIGN) 4 MG CAPS Take 8 mg by mouth daily.     [provider]  rosuvastatin (CRESTOR) 10 MG tablet Take 10 mg by mouth daily.    [provider]  sodium chloride (OCEAN) 0.65 % SOLN nasal spray Place 1-2 sprays into both nostrils as needed for congestion.    [provider]  Turmeric (QC TUMERIC COMPLEX PO) Take by mouth.    [provider]     Allergies Bupropion, Codeine, Gemfibrozil, Oxycodone-acetaminophen, Polymyxin b, Sertraline, Fenofibrate micronized, Neomycin, Penicillin g, and Tricor [fenofibrate]  Family History  Problem Relation Age of Onset   Heart attack Mother    Heart attack Father    Hypertension Father    Leukemia Sister    Depression Sister    COPD Sister    Diabetes Mellitus II Sister    Stroke Sister    Hypertension Sister    Heart attack Maternal Grandmother    Colon cancer Paternal Grandfather     Social History Social History   Tobacco Use   Smoking status: Never   Smokeless tobacco: Never  Vaping Use   Vaping Use: Never used  Substance Use Topics   Alcohol use: No   Drug use: No    Review of Systems  Constitutional: No fever/chills Eyes: No visual changes.  ENT: No sore throat. Cardiovascular: Denies chest pain. Respiratory: Denies shortness of breath. Gastrointestinal: As above Genitourinary: Negative for dysuria. Musculoskeletal: Negative for back pain. Skin: Negative for rash. Neurological: Negative for headaches   ____________________________________________   PHYSICAL  EXAM:  VITAL SIGNS: ED Triage Vitals  Enc Vitals Group     BP 06/12/21 1255 (!) 144/74     Pulse Rate 06/12/21 1255 66     Resp 06/12/21 1255 18     Temp 06/12/21 1255 98.4 F (36.9 C)     Temp Source 06/12/21 1255 Oral     SpO2 06/12/21 1255 98 %     Weight 06/12/21 1256 87.1 kg (192 lb)     Height 06/12/21 1256 1.829 m (6')     Head Circumference --      Peak Flow --      Pain Score 06/12/21 1255 0     Pain Loc --      Pain Edu? --      Excl. in [place]? --     Constitutional: Alert and oriented.  No acute distress. Pleasant and interactive  Nose: No congestion/rhinnorhea. Mouth/Throat: Mucous membranes are moist.    Cardiovascular: Normal rate, regular rhythm. Grossly normal heart sounds.  Good peripheral circulation. Respiratory: Normal respiratory effort.  No retractions. Lungs CTAB. Gastrointestinal: Soft, tenderness palpation right lower quadrant,. no distention.  No CVA tenderness.  Musculoskeletal: No lower extremity tenderness nor edema.  Warm and well perfused Neurologic:  Normal speech and language. No gross focal neurologic deficits are appreciated.  Skin:  Skin is warm, dry and intact. No rash noted. Psychiatric: Mood and affect are normal. Speech and behavior are normal.  ____________________________________________   LABS (all labs ordered are listed, but only abnormal results are displayed)  Labs Reviewed  CBC - Abnormal; Notable for the following components:      Result Value   Platelets 112 (*)    All other components within normal limits  COMPREHENSIVE METABOLIC PANEL - Abnormal; Notable for the following components:   Glucose, Bld 116 (*)    Calcium 8.8 (*)    Total Bilirubin 1.5 (*)    All other components within normal limits  LIPASE, BLOOD   ____________________________________________  EKG None ____________________________________________  RADIOLOGY  CT abdomen pelvis reviewed by  me ____________________________________________   PROCEDURES  Procedure(s) performed: No  Procedures   Critical Care performed: No ____________________________________________   INITIAL IMPRESSION / ASSESSMENT AND PLAN / ED COURSE  Pertinent labs & imaging results that were available during my care of the patient were reviewed by me and considered in my medical decision making (see chart for details).   Patient presents with abdominal pain, nausea and vomiting as detailed above.  Differential includes viral gastroenteritis, foodborne illness.  Patient also describes right lower quadrant pain, he is tender in this area.  Pending labs, will obtain CT abdomen pelvis.  Will treat with IV Zofran, IV fluids.    ____________________________________________   FINAL CLINICAL IMPRESSION(S) / ED DIAGNOSES  Final diagnoses:  None        Note:  This document was prepared using Dragon voice recognition software and may include unintentional dictation errors.    Lavonia Drafts, MD 06/12/21 1504

## 2021-07-08 ENCOUNTER — Other Ambulatory Visit: Payer: Self-pay | Admitting: Otolaryngology

## 2021-07-08 DIAGNOSIS — R42 Dizziness and giddiness: Secondary | ICD-10-CM

## 2021-07-30 ENCOUNTER — Ambulatory Visit
Admission: RE | Admit: 2021-07-30 | Discharge: 2021-07-30 | Disposition: A | Discharge status: Home / Self Care | Payer: Medicare HMO | Source: Ambulatory Visit | Attending: Otolaryngology | Admitting: Otolaryngology

## 2021-07-30 ENCOUNTER — Other Ambulatory Visit: Payer: Self-pay

## 2021-07-30 DIAGNOSIS — R42 Dizziness and giddiness: Secondary | ICD-10-CM | POA: Diagnosis not present

## 2022-02-25 ENCOUNTER — Encounter: Payer: Self-pay | Admitting: Ophthalmology

## 2022-03-02 NOTE — Discharge Instructions (Signed)

## 2022-03-04 ENCOUNTER — Ambulatory Visit: Payer: Medicare HMO | Admitting: Anesthesiology

## 2022-03-04 ENCOUNTER — Other Ambulatory Visit: Payer: Self-pay

## 2022-03-04 ENCOUNTER — Ambulatory Visit
Admission: RE | Admit: 2022-03-04 | Discharge: 2022-03-04 | Disposition: A | Discharge status: Home / Self Care | Payer: Medicare HMO | Attending: Ophthalmology | Admitting: Ophthalmology

## 2022-03-04 ENCOUNTER — Encounter: Payer: Self-pay | Admitting: Ophthalmology

## 2022-03-04 ENCOUNTER — Encounter
Admission: RE | Disposition: A | Discharge status: Home / Self Care | Payer: Self-pay | Source: Home / Self Care | Attending: Ophthalmology

## 2022-03-04 DIAGNOSIS — F419 Anxiety disorder, unspecified: Secondary | ICD-10-CM | POA: Diagnosis not present

## 2022-03-04 DIAGNOSIS — Z955 Presence of coronary angioplasty implant and graft: Secondary | ICD-10-CM | POA: Diagnosis not present

## 2022-03-04 DIAGNOSIS — G4730 Sleep apnea, unspecified: Secondary | ICD-10-CM | POA: Diagnosis not present

## 2022-03-04 DIAGNOSIS — Z79899 Other long term (current) drug therapy: Secondary | ICD-10-CM | POA: Diagnosis not present

## 2022-03-04 DIAGNOSIS — H2511 Age-related nuclear cataract, right eye: Secondary | ICD-10-CM | POA: Diagnosis not present

## 2022-03-04 DIAGNOSIS — I10 Essential (primary) hypertension: Secondary | ICD-10-CM | POA: Insufficient documentation

## 2022-03-04 HISTORY — DX: Cardiac murmur, unspecified: R01.1

## 2022-03-04 HISTORY — DX: Family history of other specified conditions: Z84.89

## 2022-03-04 HISTORY — DX: Presence of external hearing-aid: Z97.4

## 2022-03-04 SURGERY — PHACOEMULSIFICATION, CATARACT, WITH IOL INSERTION
Anesthesia: Monitor Anesthesia Care | Site: Eye | Laterality: Right

## 2022-03-04 MED ORDER — SIGHTPATH DOSE#1 BSS IO SOLN
INTRAOCULAR | Status: DC | PRN
  Administered 2022-03-04: 15 mL

## 2022-03-04 MED ORDER — ONDANSETRON HCL 4 MG/2ML IJ SOLN: 4.0000 mg | Freq: Once | INTRAMUSCULAR | Status: DC | PRN

## 2022-03-04 MED ORDER — ARMC OPHTHALMIC DILATING DROPS
1.0000 "application " | OPHTHALMIC | Status: AC | PRN
  Administered 2022-03-04 (×3): 1 via OPHTHALMIC

## 2022-03-04 MED ORDER — SIGHTPATH DOSE#1 NA HYALUR & NA CHOND-NA HYALUR IO KIT
PACK | INTRAOCULAR | Status: DC | PRN
  Administered 2022-03-04: 1 via OPHTHALMIC

## 2022-03-04 MED ORDER — BRIMONIDINE TARTRATE-TIMOLOL 0.2-0.5 % OP SOLN
OPHTHALMIC | Status: DC | PRN
  Administered 2022-03-04: 1 [drp] via OPHTHALMIC

## 2022-03-04 MED ORDER — MIDAZOLAM HCL 2 MG/2ML IJ SOLN
INTRAMUSCULAR | Status: DC | PRN
  Administered 2022-03-04 (×2): 1 mg via INTRAVENOUS

## 2022-03-04 MED ORDER — ACETAMINOPHEN 160 MG/5ML PO SOLN: 975.0000 mg | Freq: Once | ORAL | Status: DC | PRN

## 2022-03-04 MED ORDER — SIGHTPATH DOSE#1 BSS IO SOLN
INTRAOCULAR | Status: DC | PRN
  Administered 2022-03-04: 1 mL via INTRAMUSCULAR

## 2022-03-04 MED ORDER — LACTATED RINGERS IV SOLN: INTRAVENOUS | Status: DC

## 2022-03-04 MED ORDER — TETRACAINE HCL 0.5 % OP SOLN
1.0000 [drp] | OPHTHALMIC | Status: AC | PRN
  Administered 2022-03-04 (×3): 1 [drp] via OPHTHALMIC

## 2022-03-04 MED ORDER — MOXIFLOXACIN HCL 0.5 % OP SOLN
OPHTHALMIC | Status: DC | PRN
  Administered 2022-03-04: 0.2 mL via OPHTHALMIC

## 2022-03-04 MED ORDER — ACETAMINOPHEN 500 MG PO TABS: 1000.0000 mg | ORAL_TABLET | Freq: Once | ORAL | Status: DC | PRN

## 2022-03-04 MED ORDER — SIGHTPATH DOSE#1 BSS IO SOLN
INTRAOCULAR | Status: DC | PRN
  Administered 2022-03-04: 72 mL via OPHTHALMIC

## 2022-03-04 MED ORDER — FENTANYL CITRATE (PF) 100 MCG/2ML IJ SOLN
INTRAMUSCULAR | Status: DC | PRN
  Administered 2022-03-04 (×2): 50 ug via INTRAVENOUS

## 2022-03-04 SURGICAL SUPPLY — 11 items
CATARACT SUITE SIGHTPATH (MISCELLANEOUS) ×2 IMPLANT
FEE CATARACT SUITE SIGHTPATH (MISCELLANEOUS) ×1 IMPLANT
GLOVE SRG 8 PF TXTR STRL LF DI (GLOVE) ×1 IMPLANT
GLOVE SURG ENC TEXT LTX SZ7.5 (GLOVE) ×2 IMPLANT
GLOVE SURG UNDER POLY LF SZ8 (GLOVE) ×2
LENS IOL TECNIS EYHANCE 17.0 (Intraocular Lens) ×1 IMPLANT
NDL FILTER BLUNT 18X1 1/2 (NEEDLE) ×1 IMPLANT
NEEDLE FILTER BLUNT 18X 1/2SAF (NEEDLE) ×1
NEEDLE FILTER BLUNT 18X1 1/2 (NEEDLE) ×1 IMPLANT
SYR 3ML LL SCALE MARK (SYRINGE) ×2 IMPLANT
WATER STERILE IRR 250ML POUR (IV SOLUTION) ×2 IMPLANT

## 2022-03-04 NOTE — Anesthesia Postprocedure Evaluation (Signed)
Anesthesia Post Note ? ?Patient: Richard Melton ? ?Procedure(s) Performed: CATARACT EXTRACTION PHACO AND INTRAOCULAR LENS PLACEMENT (IOC) RIGHT (Right: Eye) ? ? ?  ?Patient location during evaluation: PACU ?Anesthesia Type: MAC ?Level of consciousness: awake and alert ?Pain management: pain level controlled ?Vital Signs Assessment: post-procedure vital signs reviewed and stable ?Respiratory status: spontaneous breathing, nonlabored ventilation and respiratory function stable ?Cardiovascular status: stable and blood pressure returned to baseline ?Postop Assessment: no apparent nausea or vomiting ?Anesthetic complications: no ? ? ?No notable events documented. ? ?[name] ? ? ? ? ? ?

## 2022-03-04 NOTE — H&P (Signed)
?[facility]  ? ?Primary Care Physician:  Maryland Pink, MD ?Ophthalmologist: Dr. Leandrew Koyanagi ? ?Pre-Procedure History & Physical: ?HPI:  Richard Melton is a 81 y.o. male here for ophthalmic surgery. ?  ?Past Medical History:  ?Diagnosis Date  ? Allergic   ? Anxiety   ? Cancer [facility])   ? Prostate  ? Chronic insomnia   ? Colon polyp   ? Family history of adverse reaction to anesthesia   ? Sister - PONV  ? GERD (gastroesophageal reflux disease)   ? Headache   ? Migraines  ? Heart murmur   ? Hyperlipidemia   ? Hypertension   ? Hypertriglyceridemia   ? IBS (irritable bowel syndrome)   ? IBS (irritable bowel syndrome)   ? Psoriasis   ? Sleep apnea   ? Wears hearing aid in both ears   ? ? ?Past Surgical History:  ?Procedure Laterality Date  ? CIRCUMCISION    ? COLONOSCOPY WITH PROPOFOL N/A 07/06/2017  ? Procedure: COLONOSCOPY WITH PROPOFOL;  Surgeon: Lollie Sails, MD;  Location: [facility] ENDOSCOPY;  Service: Endoscopy;  Laterality: N/A;  ? COLONOSCOPY WITH PROPOFOL N/A 05/28/2021  ? Procedure: COLONOSCOPY WITH PROPOFOL;  Surgeon: Toledo, Benay Pike, MD;  Location: [facility] ENDOSCOPY;  Service: Gastroenterology;  Laterality: N/A;  ? CORONARY STENT INTERVENTION N/A 03/15/2018  ? Procedure: CORONARY STENT INTERVENTION;  Surgeon: Isaias Cowman, MD;  Location: [place] CV LAB;  Service: Cardiovascular;  Laterality: N/A;  ? ESOPHAGOGASTRODUODENOSCOPY (EGD) WITH PROPOFOL N/A 09/07/2019  ? Procedure: ESOPHAGOGASTRODUODENOSCOPY (EGD) WITH PROPOFOL;  Surgeon: Lollie Sails, MD;  Location: [facility] ENDOSCOPY;  Service: Endoscopy;  Laterality: N/A;  ? LEFT HEART CATH AND CORONARY ANGIOGRAPHY Left 03/15/2018  ? Procedure: LEFT HEART CATH AND CORONARY ANGIOGRAPHY;  Surgeon: Isaias Cowman, MD;  Location: [place] CV LAB;  Service: Cardiovascular;  Laterality: Left;  ? PROSTATECTOMY    ? TONSILLECTOMY    ? ? ?Prior to Admission medications   ?Medication Sig Start Date End Date Taking? Authorizing  Provider  ?acetaminophen (TYLENOL) 500 MG tablet Take 500 mg by mouth every 6 (six) hours as needed for moderate pain or headache.   Yes [provider]  ?ALUMINUM & MAGNESIUM HYDROXIDE PO Take by mouth.   Yes [provider]  ?amLODipine (NORVASC) 2.5 MG tablet Take 2.5 mg by mouth every evening.    Yes [provider]  ?aspirin EC 81 MG tablet Take 81 mg by mouth daily.   Yes [provider]  ?B Complex Vitamins (VITAMIN B COMPLEX PO) Take by mouth.   Yes [provider]  ?clopidogrel (PLAVIX) 75 MG tablet Take 75 mg by mouth daily.   Yes [provider]  ?esomeprazole (NEXIUM) 40 MG capsule Take 40 mg by mouth daily.    Yes [provider]  ?famotidine (PEPCID) 40 MG tablet Take 40 mg by mouth daily.   Yes [provider]  ?lipase/protease/amylase (CREON) 36000 UNITS CPEP capsule Take by mouth.   Yes [provider]  ?mirtazapine (REMERON) 15 MG tablet Take 7.5 mg by mouth at bedtime.   Yes [provider]  ?mometasone (ELOCON) 0.1 % lotion Apply 4 drops topically daily as needed (for ear itch).   Yes [provider]  ?Multiple Vitamins-Minerals (CENTRUM SILVER PO) Take 1 tablet by mouth daily.   Yes [provider]  ?olmesartan (BENICAR) 40 MG tablet Take 40 mg by mouth daily.   Yes [provider]  ?Omega-3 Fatty Acids (OMEGA 3 PO) Take  1 capsule by mouth 2 (two) times daily.    Yes [provider]  ?Probiotic Product (ALIGN) 4 MG CAPS Take 8 mg by mouth daily.    Yes [provider]  ?rosuvastatin (CRESTOR) 10 MG tablet Take 10 mg by mouth daily.   Yes [provider]  ?sodium chloride (OCEAN) 0.65 % SOLN nasal spray Place 1-2 sprays into both nostrils as needed for congestion.   Yes [provider]  ?Turmeric (QC TUMERIC COMPLEX PO) Take by mouth.   Yes [provider]  ?Vitamin D, Cholecalciferol, 10 MCG (400 UNIT) TABS Take by mouth.   Yes  [provider]  ? ? ?Allergies as of 01/28/2022 - Review Complete 06/12/2021  ?Allergen Reaction Noted  ? Bupropion Nausea Only 06/09/2016  ? Codeine Nausea Only 03/07/2018  ? Gemfibrozil Nausea Only 07/05/2017  ? Oxycodone-acetaminophen Nausea Only 06/09/2016  ? Polymyxin b Other (See Comments) 07/05/2017  ? Sertraline Nausea Only 06/09/2016  ? Fenofibrate micronized Anxiety 06/09/2016  ? Neomycin Rash 06/09/2016  ? Penicillin g Rash and Other (See Comments) 06/09/2016  ? Tricor [fenofibrate] Anxiety 07/05/2017  ? ? ?Family History  ?Problem Relation Age of Onset  ? Heart attack Mother   ? Heart attack Father   ? Hypertension Father   ? Leukemia Sister   ? Depression Sister   ? COPD Sister   ? Diabetes Mellitus II Sister   ? Stroke Sister   ? Hypertension Sister   ? Heart attack Maternal Grandmother   ? Colon cancer Paternal Grandfather   ? ? ?Social History  ? ?Socioeconomic History  ? Marital status: Married  ?  Spouse name: Not on file  ? Number of children: Not on file  ? Years of education: Not on file  ? Highest education level: Not on file  ?Occupational History  ? Not on file  ?Tobacco Use  ? Smoking status: Never  ? Smokeless tobacco: Never  ?Vaping Use  ? Vaping Use: Never used  ?Substance and Sexual Activity  ? Alcohol use: No  ? Drug use: No  ? Sexual activity: Not on file  ?Other Topics Concern  ? Not on file  ?Social History Narrative  ? Not on file  ? ?Social Determinants of Health  ? ?Financial Resource Strain: Not on file  ?Food Insecurity: Not on file  ?Transportation Needs: Not on file  ?Physical Activity: Not on file  ?Stress: Not on file  ?Social Connections: Not on file  ?Intimate Partner Violence: Not on file  ? ? ?Review of Systems: ?See HPI, otherwise negative ROS ? ?Physical Exam: ?BP (!) 151/76   Pulse (!) 56   Temp 97.9 ?F (36.6 ?C) (Temporal)   Resp 16   Ht 6' (1.829 m)   Wt 87.1 kg   SpO2 100%   BMI 26.04 kg/m?  ?General:   Alert,  pleasant and cooperative in  NAD ?Head:  Normocephalic and atraumatic. ?Lungs:  Clear to auscultation.    ?Heart:  Regular rate and rhythm.  ? ?Impression/Plan: ?Richard Melton is here for ophthalmic surgery. ? ?Risks, benefits, limitations, and alternatives regarding ophthalmic surgery have been reviewed with the patient.  Questions have been answered.  All parties agreeable. ? ? Leandrew Koyanagi, MD  03/04/2022, 7:38 AM ? ?

## 2022-03-04 NOTE — Op Note (Signed)
LOCATION:  [place] ?  ?PREOPERATIVE DIAGNOSIS:    Nuclear sclerotic cataract right eye. H25.11 ?  ?POSTOPERATIVE DIAGNOSIS:  Nuclear sclerotic cataract right eye.   ?  ?PROCEDURE:  Phacoemusification with posterior chamber intraocular lens placement of the right eye  ? ?ULTRASOUND TIME: Procedure(s) with comments: ?CATARACT EXTRACTION PHACO AND INTRAOCULAR LENS PLACEMENT (IOC) RIGHT (Right) - 6.72 ?01:09.7 ? ?LENS:   ?Implant Name Type Inv. Item Serial No. Manufacturer Lot No. LRB No. Used Action  ?LENS IOL TECNIS EYHANCE 17.0 - [id number] Intraocular Lens LENS IOL TECNIS EYHANCE 17.0 [id number] [employer]  Right 1 Implanted  ?   ?  ?  ?SURGEON:  Wyonia Hough, MD ?  ?ANESTHESIA:  Topical with tetracaine drops and 2% Xylocaine jelly, augmented with 1% preservative-free intracameral lidocaine. ? ?  ?COMPLICATIONS:  None. ?  ?DESCRIPTION OF PROCEDURE:  The patient was identified in the holding room and transported to the operating room and placed in the supine position under the operating microscope.  The right eye was identified as the operative eye and it was prepped and draped in the usual sterile ophthalmic fashion. ?  ?A 1 millimeter clear-corneal paracentesis was made at the 12:00 position.  0.5 ml of preservative-free 1% lidocaine was injected into the anterior chamber. ?The anterior chamber was filled with Viscoat viscoelastic.  A 2.4 millimeter keratome was used to make a near-clear corneal incision at the 9:00 position.  A curvilinear capsulorrhexis was made with a cystotome and capsulorrhexis forceps.  Balanced salt solution was used to hydrodissect and hydrodelineate the nucleus. ?  ?Phacoemulsification was then used in stop and chop fashion to remove the lens nucleus and epinucleus.  The remaining cortex was then removed using the irrigation and aspiration handpiece. Provisc was then placed into the capsular bag to distend it for lens placement.  A lens was then injected into the  capsular bag.  The remaining viscoelastic was aspirated. ?  ?Wounds were hydrated with balanced salt solution.  The anterior chamber was inflated to a physiologic pressure with balanced salt solution.  No wound leaks were noted. Vigamox 0.2 ml of a '1mg'$  per ml solution was injected into the anterior chamber for a dose of 0.2 mg of intracameral antibiotic at the completion of the case. ?  Timolol and Brimonidine drops were applied to the eye.  The patient was taken to the recovery room in stable condition without complications of anesthesia or surgery. ? ? ?Teiara Baria ?03/04/2022, 8:39 AM ? ?

## 2022-03-04 NOTE — Anesthesia Preprocedure Evaluation (Signed)
Anesthesia Evaluation  ?Patient identified by MRN, date of birth, ID band ?Patient awake ? ? ? ?Reviewed: ?Allergy & Precautions, H&P , NPO status , Patient's Chart, lab work & pertinent test results, reviewed documented beta blocker date and time  ? ?Airway ?Mallampati: II ? ?TM Distance: >3 FB ?Neck ROM: full ? ? ? Dental ?no notable dental hx. ? ?  ?Pulmonary ?neg pulmonary ROS,  ?  ?Pulmonary exam normal ?breath sounds clear to auscultation ? ? ? ? ? ? Cardiovascular ?Exercise Tolerance: Good ?hypertension, + angina + CAD and + Cardiac Stents  ?+ Valvular Problems/Murmurs (murmur)  ?Rhythm:regular Rate:Normal ? ?2019 [facility] - 1.  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  Normal left ventricular function ?3.  Successful PCI with overlapping DES distal left circumflex ? ?  ?Neuro/Psych ? Headaches, negative neurological ROS ? negative psych ROS  ? GI/Hepatic ?negative GI ROS, Neg liver ROS, GERD  ,IBS ?  ?Endo/Other  ?negative endocrine ROS ? Renal/GU ?negative Renal ROS  ?negative genitourinary ?  ?Musculoskeletal ? ? Abdominal ?  ?Peds ? Hematology ?negative hematology ROS ?(+)   ?Anesthesia Other Findings ? ? Reproductive/Obstetrics ?negative OB ROS ? ?  ? ? ? ? ? ? ? ? ? ? ? ? ? ?  ?  ? ? ? ? ? ? ? ? ?Anesthesia Physical ?Anesthesia Plan ? ?ASA: 2 ? ?Anesthesia Plan: MAC  ? ?Post-op Pain Management:   ? ?Induction:  ? ?PONV Risk Score and Plan: 1 and Midazolam, TIVA and Treatment may vary due to age or medical condition ? ?Airway Management Planned:  ? ?Additional Equipment:  ? ?Intra-op Plan:  ? ?Post-operative Plan:  ? ?Informed Consent: I have reviewed the patients History and Physical, chart, labs and discussed the procedure including the risks, benefits and alternatives for the proposed anesthesia with the patient  or authorized representative who has indicated his/her understanding and acceptance.  ? ? ? ?Dental Advisory Given ? ?Plan Discussed with: CRNA ? ?Anesthesia Plan Comments:   ? ? ? ? ? ? ?Anesthesia Quick Evaluation ? ?

## 2022-03-04 NOTE — Transfer of Care (Signed)
Immediate Anesthesia Transfer of Care Note ? ?Patient: Richard Melton ? ?Procedure(s) Performed: CATARACT EXTRACTION PHACO AND INTRAOCULAR LENS PLACEMENT (IOC) RIGHT (Right: Eye) ? ?Patient Location: PACU ? ?Anesthesia Type: MAC ? ?Level of Consciousness: awake, alert  and patient cooperative ? ?Airway and Oxygen Therapy: Patient Spontanous Breathing and Patient connected to supplemental oxygen ? ?Post-op Assessment: Post-op Vital signs reviewed, Patient's Cardiovascular Status Stable, Respiratory Function Stable, Patent Airway and No signs of Nausea or vomiting ? ?Post-op Vital Signs: Reviewed and stable ? ?Complications: No notable events documented. ? ?

## 2022-03-05 ENCOUNTER — Encounter: Payer: Self-pay | Admitting: Ophthalmology

## 2022-03-16 NOTE — Discharge Instructions (Signed)

## 2022-03-18 ENCOUNTER — Other Ambulatory Visit: Payer: Self-pay

## 2022-03-18 ENCOUNTER — Encounter
Admission: RE | Disposition: A | Discharge status: Home / Self Care | Payer: Self-pay | Source: Home / Self Care | Attending: Ophthalmology

## 2022-03-18 ENCOUNTER — Ambulatory Visit: Payer: Medicare HMO | Admitting: Anesthesiology

## 2022-03-18 ENCOUNTER — Encounter: Payer: Self-pay | Admitting: Ophthalmology

## 2022-03-18 ENCOUNTER — Ambulatory Visit
Admission: RE | Admit: 2022-03-18 | Discharge: 2022-03-18 | Disposition: A | Discharge status: Home / Self Care | Payer: Medicare HMO | Attending: Ophthalmology | Admitting: Ophthalmology

## 2022-03-18 DIAGNOSIS — E785 Hyperlipidemia, unspecified: Secondary | ICD-10-CM | POA: Insufficient documentation

## 2022-03-18 DIAGNOSIS — K219 Gastro-esophageal reflux disease without esophagitis: Secondary | ICD-10-CM | POA: Insufficient documentation

## 2022-03-18 DIAGNOSIS — I10 Essential (primary) hypertension: Secondary | ICD-10-CM | POA: Diagnosis not present

## 2022-03-18 DIAGNOSIS — H2512 Age-related nuclear cataract, left eye: Secondary | ICD-10-CM | POA: Insufficient documentation

## 2022-03-18 HISTORY — PX: CATARACT EXTRACTION W/PHACO: SHX586

## 2022-03-18 SURGERY — PHACOEMULSIFICATION, CATARACT, WITH IOL INSERTION
Anesthesia: Monitor Anesthesia Care | Site: Eye | Laterality: Left

## 2022-03-18 MED ORDER — SIGHTPATH DOSE#1 BSS IO SOLN
INTRAOCULAR | Status: DC | PRN
  Administered 2022-03-18: 1 mL via INTRAMUSCULAR

## 2022-03-18 MED ORDER — ONDANSETRON HCL 4 MG/2ML IJ SOLN
INTRAMUSCULAR | Status: DC | PRN
  Administered 2022-03-18: 4 mg via INTRAVENOUS

## 2022-03-18 MED ORDER — ACETAMINOPHEN 10 MG/ML IV SOLN: 1000.0000 mg | Freq: Once | INTRAVENOUS | Status: DC | PRN

## 2022-03-18 MED ORDER — SIGHTPATH DOSE#1 NA HYALUR & NA CHOND-NA HYALUR IO KIT
PACK | INTRAOCULAR | Status: DC | PRN
  Administered 2022-03-18: 1 via OPHTHALMIC

## 2022-03-18 MED ORDER — LACTATED RINGERS IV SOLN: INTRAVENOUS | Status: DC

## 2022-03-18 MED ORDER — TETRACAINE HCL 0.5 % OP SOLN
1.0000 [drp] | OPHTHALMIC | Status: DC | PRN
  Administered 2022-03-18 (×3): 1 [drp] via OPHTHALMIC

## 2022-03-18 MED ORDER — MIDAZOLAM HCL 2 MG/2ML IJ SOLN
INTRAMUSCULAR | Status: DC | PRN
  Administered 2022-03-18: 2 mg via INTRAVENOUS

## 2022-03-18 MED ORDER — ARMC OPHTHALMIC DILATING DROPS
1.0000 "application " | OPHTHALMIC | Status: DC | PRN
  Administered 2022-03-18 (×3): 1 via OPHTHALMIC

## 2022-03-18 MED ORDER — FENTANYL CITRATE (PF) 100 MCG/2ML IJ SOLN
INTRAMUSCULAR | Status: DC | PRN
  Administered 2022-03-18 (×2): 50 ug via INTRAVENOUS

## 2022-03-18 MED ORDER — ONDANSETRON HCL 4 MG/2ML IJ SOLN: 4.0000 mg | Freq: Once | INTRAMUSCULAR | Status: DC | PRN

## 2022-03-18 MED ORDER — BRIMONIDINE TARTRATE-TIMOLOL 0.2-0.5 % OP SOLN
OPHTHALMIC | Status: DC | PRN
  Administered 2022-03-18: 1 [drp] via OPHTHALMIC

## 2022-03-18 MED ORDER — MOXIFLOXACIN HCL 0.5 % OP SOLN
OPHTHALMIC | Status: DC | PRN
  Administered 2022-03-18: 0.2 mL via OPHTHALMIC

## 2022-03-18 MED ORDER — SIGHTPATH DOSE#1 BSS IO SOLN
INTRAOCULAR | Status: DC | PRN
  Administered 2022-03-18: 15 mL

## 2022-03-18 MED ORDER — SIGHTPATH DOSE#1 BSS IO SOLN
INTRAOCULAR | Status: DC | PRN
  Administered 2022-03-18: 92 mL via OPHTHALMIC

## 2022-03-18 SURGICAL SUPPLY — 11 items
CATARACT SUITE SIGHTPATH (MISCELLANEOUS) ×2 IMPLANT
FEE CATARACT SUITE SIGHTPATH (MISCELLANEOUS) ×1 IMPLANT
GLOVE SRG 8 PF TXTR STRL LF DI (GLOVE) ×1 IMPLANT
GLOVE SURG ENC TEXT LTX SZ7.5 (GLOVE) ×2 IMPLANT
GLOVE SURG UNDER POLY LF SZ8 (GLOVE) ×2
LENS IOL TECNIS EYHANCE 17.5 (Intraocular Lens) ×1 IMPLANT
NDL FILTER BLUNT 18X1 1/2 (NEEDLE) ×1 IMPLANT
NEEDLE FILTER BLUNT 18X 1/2SAF (NEEDLE) ×1
NEEDLE FILTER BLUNT 18X1 1/2 (NEEDLE) ×1 IMPLANT
SYR 3ML LL SCALE MARK (SYRINGE) ×2 IMPLANT
WATER STERILE IRR 250ML POUR (IV SOLUTION) ×2 IMPLANT

## 2022-03-18 NOTE — Op Note (Signed)
OPERATIVE NOTE ? ?KEIYON PLACK ?[id number] ?03/18/2022 ? ? ?PREOPERATIVE DIAGNOSIS:  Nuclear sclerotic cataract left eye. H25.12 ?  ?POSTOPERATIVE DIAGNOSIS:    Nuclear sclerotic cataract left eye.   ?  ?PROCEDURE:  Phacoemusification with posterior chamber intraocular lens placement of the left eye  ?Ultrasound time: Procedure(s): ?CATARACT EXTRACTION PHACO AND INTRAOCULAR LENS PLACEMENT (IOC) LEFT 5.85 01:02.2 (Left) ? ?LENS:   ?Implant Name Type Inv. Item Serial No. Manufacturer Lot No. LRB No. Used Action  ?LENS IOL TECNIS EYHANCE 17.5 - [id number] Intraocular Lens LENS IOL TECNIS EYHANCE 17.5 [id number] [employer]  Left 1 Implanted  ?   ? ?SURGEON:  Wyonia Hough, MD ?  ?ANESTHESIA:  Topical with tetracaine drops and 2% Xylocaine jelly, augmented with 1% preservative-free intracameral lidocaine. ? ?  ?COMPLICATIONS:  None. ?  ?DESCRIPTION OF PROCEDURE:  The patient was identified in the holding room and transported to the operating room and placed in the supine position under the operating microscope.  The left eye was identified as the operative eye and it was prepped and draped in the usual sterile ophthalmic fashion. ?  ?A 1 millimeter clear-corneal paracentesis was made at the 1:30 position.  0.5 ml of preservative-free 1% lidocaine was injected into the anterior chamber. ? The anterior chamber was filled with Viscoat viscoelastic.  A 2.4 millimeter keratome was used to make a near-clear corneal incision at the 10:30 position.  .  A curvilinear capsulorrhexis was made with a cystotome and capsulorrhexis forceps.  Balanced salt solution was used to hydrodissect and hydrodelineate the nucleus. ?  ?Phacoemulsification was then used in stop and chop fashion to remove the lens nucleus and epinucleus.  The remaining cortex was then removed using the irrigation and aspiration handpiece. Provisc was then placed into the capsular bag to distend it for lens placement.  A lens was then injected into the  capsular bag.  The remaining viscoelastic was aspirated. ?  ?Wounds were hydrated with balanced salt solution.  The anterior chamber was inflated to a physiologic pressure with balanced salt solution.  No wound leaks were noted. Cefuroxime 0.1 ml of a '10mg'$ /ml solution was injected into the anterior chamber for a dose of 1 mg of intracameral antibiotic at the completion of the case. ?  Timolol and Brimonidine drops were applied to the eye.  The patient was taken to the recovery room in stable condition without complications of anesthesia or surgery. ? ?Garo Heidelberg ?03/18/2022, 10:34 AM ? ?

## 2022-03-18 NOTE — Anesthesia Postprocedure Evaluation (Signed)
Anesthesia Post Note ? ?Patient: Richard Melton ? ?Procedure(s) Performed: CATARACT EXTRACTION PHACO AND INTRAOCULAR LENS PLACEMENT (IOC) LEFT 5.85 01:02.2 (Left: Eye) ? ? ?  ?Patient location during evaluation: PACU ?Anesthesia Type: MAC ?Level of consciousness: awake and alert ?Pain management: pain level controlled ?Vital Signs Assessment: post-procedure vital signs reviewed and stable ?Respiratory status: spontaneous breathing, nonlabored ventilation, respiratory function stable and patient connected to nasal cannula oxygen ?Cardiovascular status: stable and blood pressure returned to baseline ?Postop Assessment: no apparent nausea or vomiting ?Anesthetic complications: no ? ? ?No notable events documented. ? ?Tonnia Bardin A  Makaylee Spielberg ? ? ? ? ? ?

## 2022-03-18 NOTE — Anesthesia Preprocedure Evaluation (Signed)
Anesthesia Evaluation  ?Patient identified by MRN, date of birth, ID band ?Patient awake ? ? ? ?Reviewed: ?Allergy & Precautions, NPO status , Patient's Chart, lab work & pertinent test results, reviewed documented beta blocker date and time  ? ?History of Anesthesia Complications ?(+) PONV and history of anesthetic complications ? ?Airway ?Mallampati: III ? ?TM Distance: >3 FB ?Neck ROM: Limited ? ? ? Dental ?  ?Pulmonary ?sleep apnea ,  ?  ?breath sounds clear to auscultation ? ? ? ? ? ? Cardiovascular ?hypertension, (-) angina+ CAD and + Cardiac Stents (02/2018 DES to LCx, on Plavix)  ?(-) DOE  ?Rhythm:Regular Rate:Normal ? ? ?HLD ? ?TTE (05/2021): ?LVEF 55%, mild LVH ?Mild TR, mild AI, mild AS ?Aortic root mildly dilated ?Mild biatrial enlargement ?Mild RV enlargement ?  ?Neuro/Psych ? Headaches, Anxiety   ? GI/Hepatic ?GERD  Controlled, ?IBS ?  ?Endo/Other  ? ? Renal/GU ?  ? ?  ?Musculoskeletal ? ? Abdominal ?  ?Peds ? Hematology ?  ?Anesthesia Other Findings ?Prostate cancer ? Reproductive/Obstetrics ? ?  ? ? ? ? ? ? ? ? ? ? ? ? ? ?  ?  ? ? ? ? ? ? ? ? ?Anesthesia Physical ?Anesthesia Plan ? ?ASA: 3 ? ?Anesthesia Plan: MAC  ? ?Post-op Pain Management:   ? ?Induction: Intravenous ? ?PONV Risk Score and Plan: 1 and TIVA, Midazolam, Treatment may vary due to age or medical condition and Ondansetron ? ?Airway Management Planned: Nasal Cannula ? ?Additional Equipment:  ? ?Intra-op Plan:  ? ?Post-operative Plan:  ? ?Informed Consent: I have reviewed the patients History and Physical, chart, labs and discussed the procedure including the risks, benefits and alternatives for the proposed anesthesia with the patient or authorized representative who has indicated his/her understanding and acceptance.  ? ? ? ? ? ?Plan Discussed with: CRNA and Anesthesiologist ? ?Anesthesia Plan Comments:   ? ? ? ? ? ? ?Anesthesia Quick Evaluation ? ?

## 2022-03-18 NOTE — Anesthesia Procedure Notes (Signed)
Procedure Name: [place] ?Date/Time: 03/18/2022 10:16 AM ?Performed by: Jeannene Patella, CRNA ?Pre-anesthesia Checklist: Patient identified, Emergency Drugs available, Suction available, Timeout performed and Patient being monitored ?Patient Re-evaluated:Patient Re-evaluated prior to induction ?Oxygen Delivery Method: Nasal cannula ?Placement Confirmation: positive ETCO2 ? ? ? ? ?

## 2022-03-18 NOTE — Transfer of Care (Signed)
Immediate Anesthesia Transfer of Care Note ? ?Patient: Richard Melton ? ?Procedure(s) Performed: CATARACT EXTRACTION PHACO AND INTRAOCULAR LENS PLACEMENT (IOC) LEFT 5.85 01:02.2 (Left: Eye) ? ?Patient Location: PACU ? ?Anesthesia Type: MAC ? ?Level of Consciousness: awake, alert  and patient cooperative ? ?Airway and Oxygen Therapy: Patient Spontanous Breathing and Patient connected to supplemental oxygen ? ?Post-op Assessment: Post-op Vital signs reviewed, Patient's Cardiovascular Status Stable, Respiratory Function Stable, Patent Airway and No signs of Nausea or vomiting ? ?Post-op Vital Signs: Reviewed and stable ? ?Complications: No notable events documented. ? ?

## 2022-03-18 NOTE — H&P (Signed)
[place]  ? ?Primary Care Physician:  Maryland Pink, MD ?Ophthalmologist: Dr. Leandrew Koyanagi ? ?Pre-Procedure History & Physical: ?HPI:  Richard Melton is a 81 y.o. male here for ophthalmic surgery. ?  ?Past Medical History:  ?Diagnosis Date  ? Allergic   ? Anxiety   ? Cancer [facility])   ? Prostate  ? Chronic insomnia   ? Colon polyp   ? Family history of adverse reaction to anesthesia   ? Sister - PONV  ? GERD (gastroesophageal reflux disease)   ? Headache   ? Migraines  ? Heart murmur   ? Hyperlipidemia   ? Hypertension   ? Hypertriglyceridemia   ? IBS (irritable bowel syndrome)   ? IBS (irritable bowel syndrome)   ? Psoriasis   ? Sleep apnea   ? Wears hearing aid in both ears   ? ? ?Past Surgical History:  ?Procedure Laterality Date  ? CATARACT EXTRACTION W/PHACO Right 03/04/2022  ? Procedure: CATARACT EXTRACTION PHACO AND INTRAOCULAR LENS PLACEMENT ([place]) RIGHT;  Surgeon: Leandrew Koyanagi, MD;  Location: [place];  Service: Ophthalmology;  Laterality: Right;  6.72 ?01:09.7  ? CIRCUMCISION    ? COLONOSCOPY WITH PROPOFOL N/A 07/06/2017  ? Procedure: COLONOSCOPY WITH PROPOFOL;  Surgeon: Lollie Sails, MD;  Location: [facility] ENDOSCOPY;  Service: Endoscopy;  Laterality: N/A;  ? COLONOSCOPY WITH PROPOFOL N/A 05/28/2021  ? Procedure: COLONOSCOPY WITH PROPOFOL;  Surgeon: Toledo, Benay Pike, MD;  Location: [facility] ENDOSCOPY;  Service: Gastroenterology;  Laterality: N/A;  ? CORONARY STENT INTERVENTION N/A 03/15/2018  ? Procedure: CORONARY STENT INTERVENTION;  Surgeon: Isaias Cowman, MD;  Location: [place] CV LAB;  Service: Cardiovascular;  Laterality: N/A;  ? ESOPHAGOGASTRODUODENOSCOPY (EGD) WITH PROPOFOL N/A 09/07/2019  ? Procedure: ESOPHAGOGASTRODUODENOSCOPY (EGD) WITH PROPOFOL;  Surgeon: Lollie Sails, MD;  Location: [facility] ENDOSCOPY;  Service: Endoscopy;  Laterality: N/A;  ? LEFT HEART CATH AND CORONARY ANGIOGRAPHY Left 03/15/2018  ? Procedure: LEFT HEART CATH AND CORONARY  ANGIOGRAPHY;  Surgeon: Isaias Cowman, MD;  Location: [place] [facility];  Service: Cardiovascular;  Laterality: Left;  ? PROSTATECTOMY    ? TONSILLECTOMY    ? ? ?Prior to Admission medications   ?Medication Sig Start Date End Date Taking? Authorizing Provider  ?acetaminophen (TYLENOL) 500 MG tablet Take 500 mg by mouth every 6 (six) hours as needed for moderate pain or headache.   Yes [provider]  ?ALUMINUM & MAGNESIUM HYDROXIDE PO Take by mouth.   Yes [provider]  ?amLODipine (NORVASC) 2.5 MG tablet Take 2.5 mg by mouth every evening.    Yes [provider]  ?aspirin EC 81 MG tablet Take 81 mg by mouth daily.   Yes [provider]  ?B Complex Vitamins (VITAMIN B COMPLEX PO) Take by mouth.   Yes [provider]  ?clopidogrel (PLAVIX) 75 MG tablet Take 75 mg by mouth daily.   Yes [provider]  ?esomeprazole (NEXIUM) 40 MG capsule Take 40 mg by mouth daily.    Yes [provider]  ?famotidine (PEPCID) 40 MG tablet Take 40 mg by mouth daily.   Yes [provider]  ?lipase/protease/amylase (CREON) 36000 UNITS CPEP capsule Take by mouth.   Yes [provider]  ?mirtazapine (REMERON) 15 MG tablet Take 7.5 mg by mouth at bedtime.   Yes [provider]  ?Multiple Vitamins-Minerals (CENTRUM SILVER PO) Take 1 tablet by mouth daily.   Yes [provider]  ?olmesartan (BENICAR) 40 MG tablet Take 40 mg by  mouth daily.   Yes [provider]  ?Omega-3 Fatty Acids (OMEGA 3 PO) Take 1 capsule by mouth 2 (two) times daily.    Yes [provider]  ?Probiotic Product (ALIGN) 4 MG CAPS Take 8 mg by mouth daily.    Yes [provider]  ?rosuvastatin (CRESTOR) 10 MG tablet Take 10 mg by mouth daily.   Yes [provider]  ?Turmeric (QC TUMERIC COMPLEX PO) Take by mouth.   Yes [provider]  ?Vitamin D, Cholecalciferol, 10 MCG (400 UNIT) TABS Take by mouth.   Yes [provider]  ?mometasone (ELOCON) 0.1 % lotion Apply 4 drops topically daily as needed (for ear itch).    [provider]  ?sodium chloride (OCEAN) 0.65 % SOLN nasal spray Place 1-2 sprays into both nostrils as needed for congestion.    [provider]  ? ? ?Allergies as of 01/28/2022 - Review Complete 06/12/2021  ?Allergen Reaction Noted  ? Bupropion Nausea Only 06/09/2016  ? Codeine Nausea Only 03/07/2018  ? Gemfibrozil Nausea Only 07/05/2017  ? Oxycodone-acetaminophen Nausea Only 06/09/2016  ? Polymyxin b Other (See Comments) 07/05/2017  ? Sertraline Nausea Only 06/09/2016  ? Fenofibrate micronized Anxiety 06/09/2016  ? Neomycin Rash 06/09/2016  ? Penicillin g Rash and Other (See Comments) 06/09/2016  ? Tricor [fenofibrate] Anxiety 07/05/2017  ? ? ?Family History  ?Problem Relation Age of Onset  ? Heart attack Mother   ? Heart attack Father   ? Hypertension Father   ? Leukemia Sister   ? Depression Sister   ? COPD Sister   ? Diabetes Mellitus II Sister   ? Stroke Sister   ? Hypertension Sister   ? Heart attack Maternal Grandmother   ? Colon cancer Paternal Grandfather   ? ? ?Social History  ? ?Socioeconomic History  ? Marital status: Married  ?  Spouse name: Not on file  ? Number of children: Not on file  ? Years of education: Not on file  ? Highest education level: Not on file  ?Occupational History  ? Not on file  ?Tobacco Use  ? Smoking status: Never  ? Smokeless tobacco: Never  ?Vaping Use  ? Vaping Use: Never used  ?Substance and Sexual Activity  ? Alcohol use: No  ? Drug use: No  ? Sexual activity: Not on file  ?Other Topics Concern  ? Not on file  ?Social History Narrative  ? Not on file  ? ?Social Determinants of Health  ? ?Financial Resource Strain: Not on file  ?Food Insecurity: Not on file  ?Transportation Needs: Not on file  ?Physical Activity: Not on file  ?Stress: Not on file  ?Social Connections: Not on file  ?Intimate Partner Violence: Not on file  ? ? ?Review of Systems: ?See  HPI, otherwise negative ROS ? ?Physical Exam: ?BP (!) 165/85   Pulse (!) 57   Temp (!) 97.2 ?F (36.2 ?C) (Temporal)   Wt 90.3 kg   SpO2 99%   BMI 26.99 kg/m?  ?General:   Alert,  pleasant and cooperative in NAD ?Head:  Normocephalic and atraumatic. ?Lungs:  Clear to auscultation.    ?Heart:  Regular rate and rhythm.  ? ?Impression/Plan: ?NKOSI CORTRIGHT is here for ophthalmic surgery. ? ?Risks, benefits, limitations, and alternatives regarding ophthalmic surgery have been reviewed with the patient.  Questions have been answered.  All parties agreeable. ? ? Leandrew Koyanagi, MD  03/18/2022, 10:07 AM ? ? ?

## 2022-03-19 ENCOUNTER — Encounter: Payer: Self-pay | Admitting: Ophthalmology

## 2022-06-22 IMAGING — US US CAROTID DUPLEX BILAT
1 series · 13 of 24 positions shown · non-contrast
Comparison: None.

CLINICAL DATA: Dizziness, history of hypertension and history
coronary artery disease with prior PCI and left circumflex coronary
stenting.

EXAM:
BILATERAL CAROTID DUPLEX ULTRASOUND
TECHNIQUE: Gray scale imaging, color Doppler and duplex ultrasound were
performed of bilateral carotid and vertebral arteries in the neck.

[Series 1: us carotid bilateral · 13 of 68 slices shown]
[im 1/68]
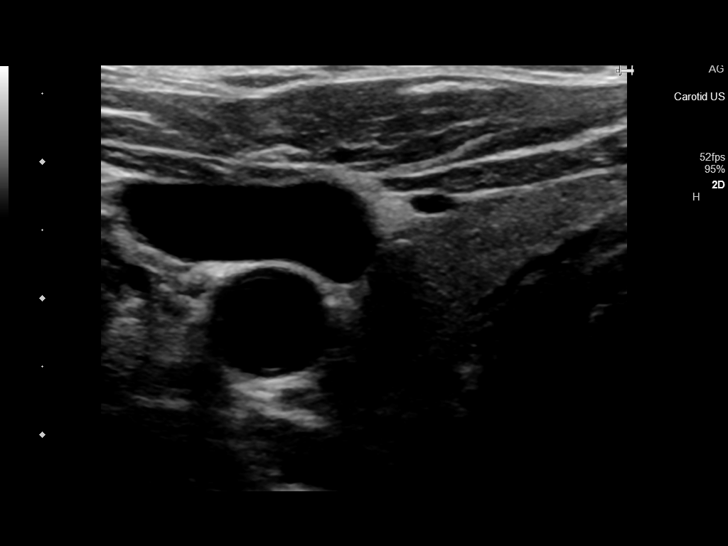
[im 6/68]
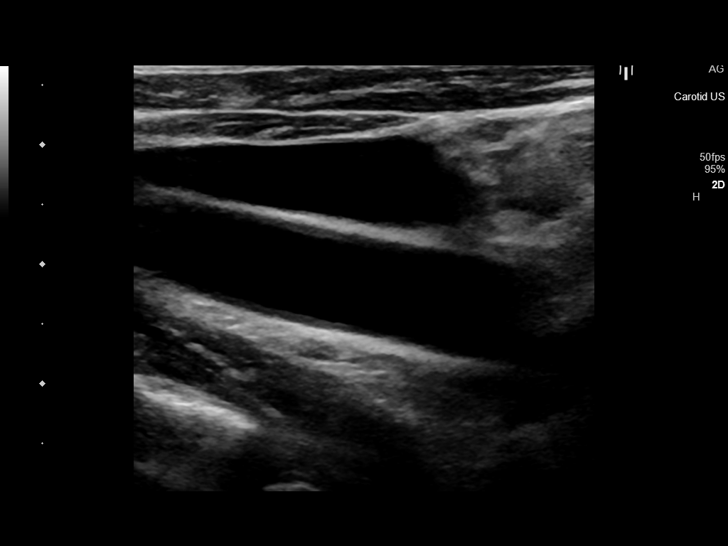
[im 12/68]
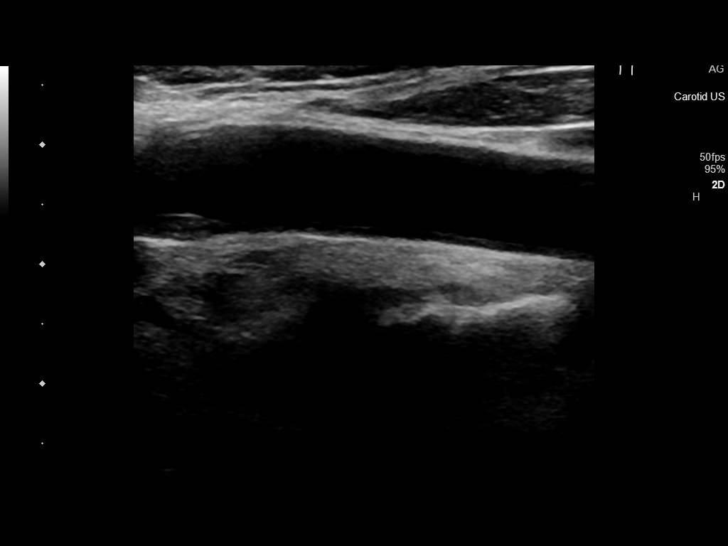
[im 18/68]
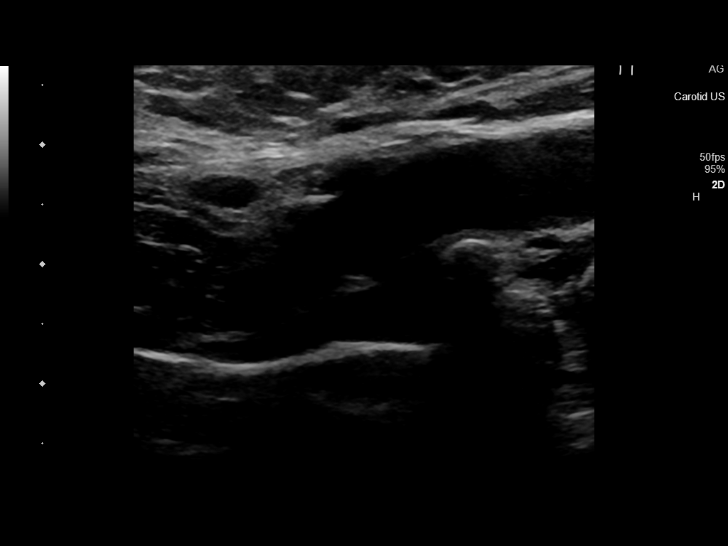
[im 24/68]
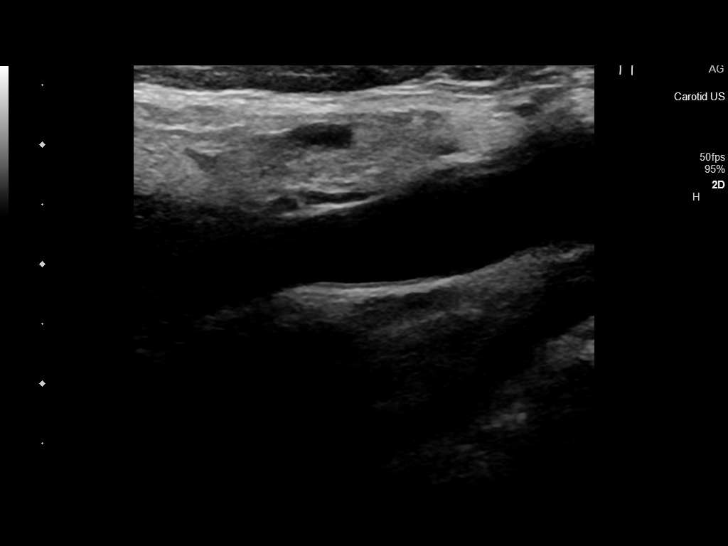
[im 30/68]
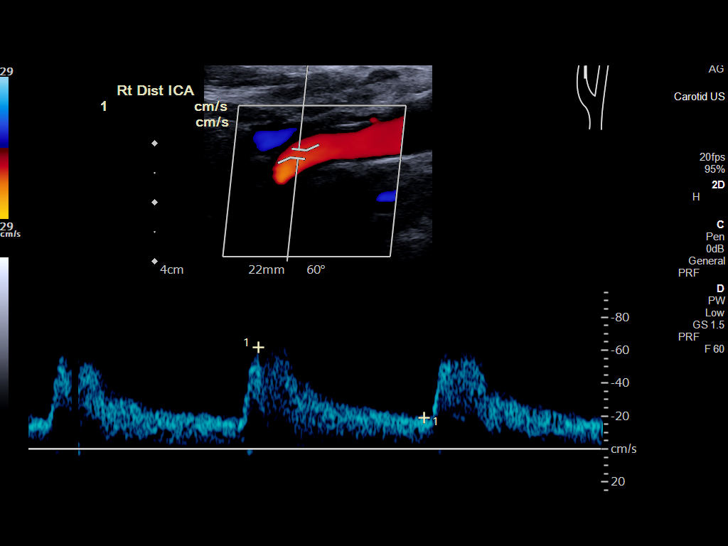
[im 35/68]
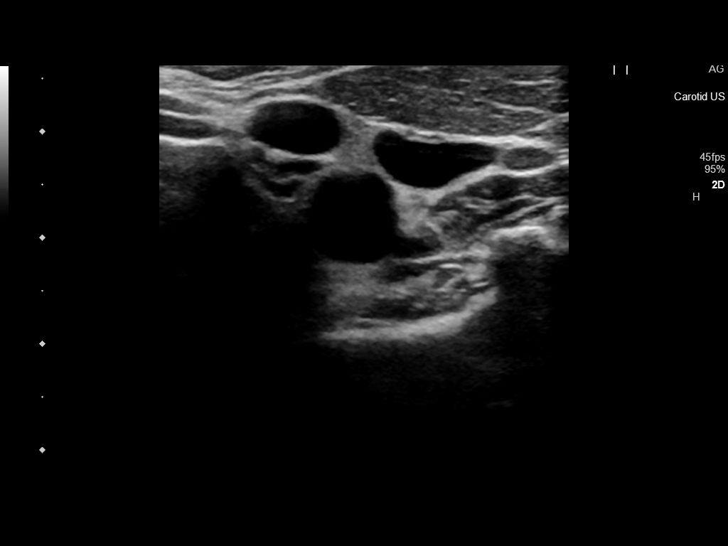
[im 38/68]
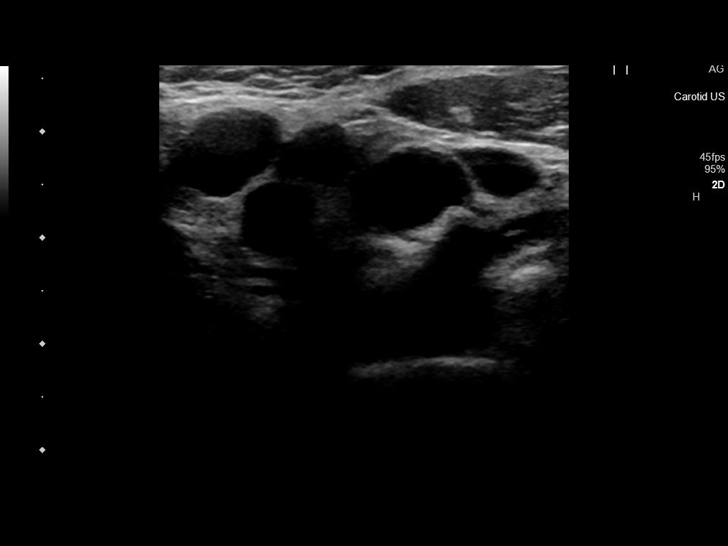
[im 44/68]
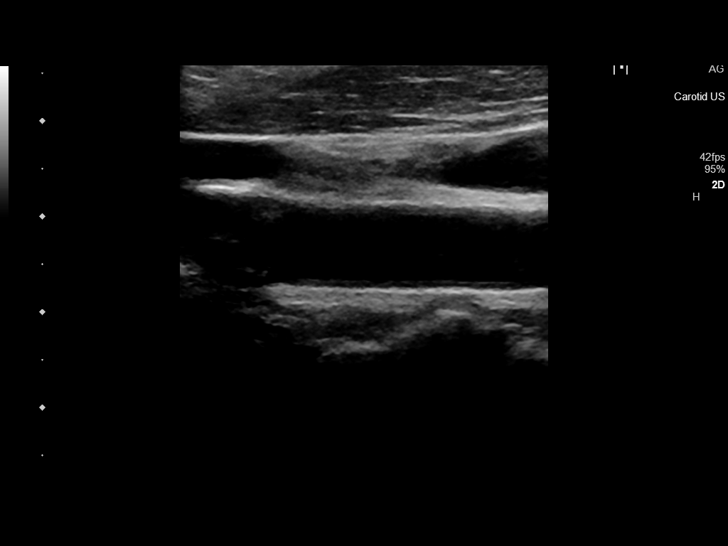
[im 50/68]
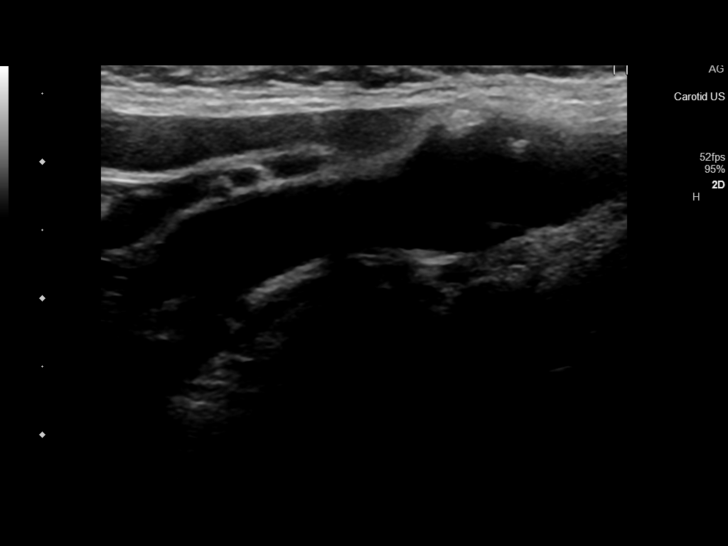
[im 56/68]
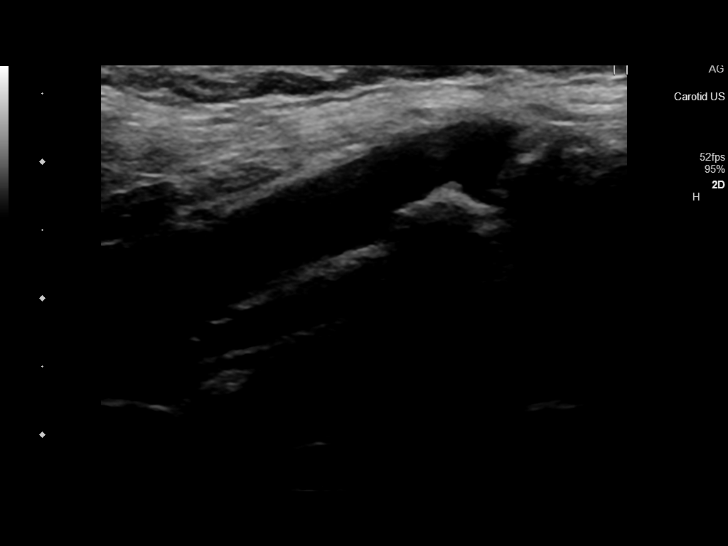
[im 62/68]
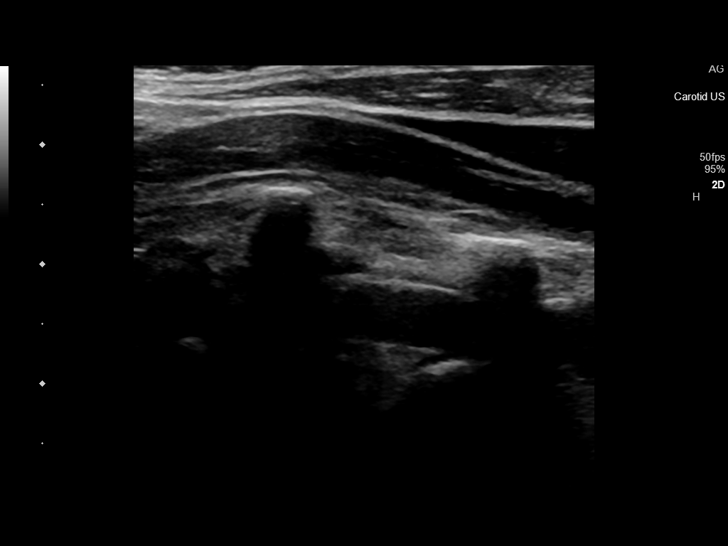
[im 68/68]
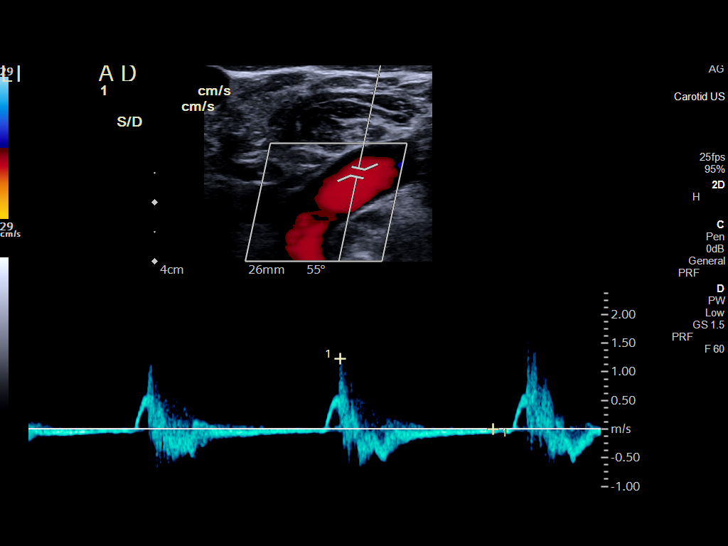

[13 of 24 positions shown; findings below may reference images not displayed]

FINDINGS: Criteria: Quantification of carotid stenosis is based on velocity
parameters that correlate the residual internal carotid diameter
with NASCET-based stenosis levels, using the diameter of the distal
internal carotid lumen as the denominator for stenosis measurement.

The following velocity measurements were obtained:

RIGHT

ICA:  64/17 cm/sec

CCA:  80/15 cm/sec

SYSTOLIC ICA/CCA RATIO:

ECA:  66 cm/sec

LEFT

ICA:  76/27 cm/sec

CCA:  58/13 cm/sec

SYSTOLIC ICA/CCA RATIO:

ECA:  60 cm/sec

RIGHT CAROTID ARTERY: Mild amount of calcified plaque is present at
the level of the carotid bulb and proximal right ICA. Estimated
right ICA stenosis is less than 50%.

RIGHT VERTEBRAL ARTERY: Antegrade flow with normal waveform and
velocity.

LEFT CAROTID ARTERY: Mild amount of calcified plaque is present at
the level of the carotid bulb and proximal left ICA. Estimated left
ICA stenosis is less than 50%.

LEFT VERTEBRAL ARTERY: Antegrade flow with normal waveform and
velocity.
IMPRESSION: Mild amount of plaque at the level of both carotid bulbs and
proximal internal carotid arteries. Estimated bilateral ICA stenoses
are less than 50%

## 2022-07-03 ENCOUNTER — Ambulatory Visit: Payer: Medicare HMO | Admitting: Cardiovascular Disease

## 2023-05-13 ENCOUNTER — Other Ambulatory Visit: Payer: Self-pay | Admitting: Student

## 2023-05-13 DIAGNOSIS — R202 Paresthesia of skin: Secondary | ICD-10-CM

## 2023-05-13 DIAGNOSIS — R200 Anesthesia of skin: Secondary | ICD-10-CM

## 2023-05-13 DIAGNOSIS — R519 Headache, unspecified: Secondary | ICD-10-CM

## 2023-05-26 ENCOUNTER — Other Ambulatory Visit: Payer: Medicare HMO

## 2023-06-25 ENCOUNTER — Other Ambulatory Visit: Payer: Self-pay | Admitting: Student

## 2023-06-28 ENCOUNTER — Ambulatory Visit
Admission: RE | Admit: 2023-06-28 | Discharge: 2023-06-28 | Disposition: A | Discharge status: Home / Self Care | Payer: Medicare HMO | Source: Ambulatory Visit | Attending: Student | Admitting: Student

## 2023-06-28 DIAGNOSIS — R519 Headache, unspecified: Secondary | ICD-10-CM

## 2023-06-28 DIAGNOSIS — R202 Paresthesia of skin: Secondary | ICD-10-CM

## 2023-06-28 DIAGNOSIS — R200 Anesthesia of skin: Secondary | ICD-10-CM

## 2024-05-17 ENCOUNTER — Encounter (HOSPITAL_COMMUNITY): Payer: Self-pay

## 2024-05-17 ENCOUNTER — Ambulatory Visit
Admission: RE | Admit: 2024-05-17 | Discharge: 2024-05-17 | Disposition: A | Discharge status: Home / Self Care | Source: Ambulatory Visit | Attending: Physician Assistant | Admitting: Physician Assistant

## 2024-05-17 ENCOUNTER — Other Ambulatory Visit: Payer: Self-pay | Admitting: Physician Assistant

## 2024-05-17 ENCOUNTER — Encounter: Payer: Self-pay | Admitting: Physician Assistant

## 2024-05-17 ENCOUNTER — Ambulatory Visit (HOSPITAL_COMMUNITY)

## 2024-05-17 DIAGNOSIS — Z1331 Encounter for screening for depression: Secondary | ICD-10-CM

## 2024-05-17 DIAGNOSIS — R200 Anesthesia of skin: Secondary | ICD-10-CM

## 2024-05-17 DIAGNOSIS — R110 Nausea: Secondary | ICD-10-CM

## 2024-05-17 DIAGNOSIS — R202 Paresthesia of skin: Secondary | ICD-10-CM

## 2024-05-17 DIAGNOSIS — G8929 Other chronic pain: Secondary | ICD-10-CM

## 2024-05-17 DIAGNOSIS — R519 Headache, unspecified: Secondary | ICD-10-CM

## 2024-05-17 DIAGNOSIS — G629 Polyneuropathy, unspecified: Secondary | ICD-10-CM

## 2024-05-17 DIAGNOSIS — R42 Dizziness and giddiness: Secondary | ICD-10-CM
